# Patient Record
Sex: Male | Born: 1965 | Race: White | Hispanic: No | Marital: Single | Smoking: Former smoker
Health system: Southern US, Community
[De-identification: ages and names within clinical notes are randomized; demographics above are authoritative.]

## PROBLEM LIST (undated history)

## (undated) DIAGNOSIS — I10 Essential (primary) hypertension: Secondary | ICD-10-CM

## (undated) DIAGNOSIS — I2510 Atherosclerotic heart disease of native coronary artery without angina pectoris: Secondary | ICD-10-CM

## (undated) DIAGNOSIS — E785 Hyperlipidemia, unspecified: Secondary | ICD-10-CM

## (undated) DIAGNOSIS — I214 Non-ST elevation (NSTEMI) myocardial infarction: Secondary | ICD-10-CM

## (undated) DIAGNOSIS — K5792 Diverticulitis of intestine, part unspecified, without perforation or abscess without bleeding: Secondary | ICD-10-CM

## (undated) HISTORY — DX: Hyperlipidemia, unspecified: E78.5

## (undated) HISTORY — DX: Essential (primary) hypertension: I10

## (undated) HISTORY — DX: Atherosclerotic heart disease of native coronary artery without angina pectoris: I25.10

## (undated) HISTORY — DX: Non-ST elevation (NSTEMI) myocardial infarction: I21.4

---
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
## 2012-08-16 ENCOUNTER — Emergency Department (HOSPITAL_COMMUNITY): Payer: Self-pay

## 2012-08-16 ENCOUNTER — Inpatient Hospital Stay (HOSPITAL_COMMUNITY)
Admission: EM | Admit: 2012-08-16 | Discharge: 2012-08-20 | DRG: 184 | Disposition: A | Discharge status: Home / Self Care | Payer: MEDICAID | Attending: General Surgery | Admitting: General Surgery

## 2012-08-16 ENCOUNTER — Encounter (HOSPITAL_COMMUNITY): Payer: Self-pay | Admitting: *Deleted

## 2012-08-16 DIAGNOSIS — F10229 Alcohol dependence with intoxication, unspecified: Secondary | ICD-10-CM | POA: Diagnosis present

## 2012-08-16 DIAGNOSIS — S81812A Laceration without foreign body, left lower leg, initial encounter: Secondary | ICD-10-CM

## 2012-08-16 DIAGNOSIS — IMO0002 Reserved for concepts with insufficient information to code with codable children: Secondary | ICD-10-CM | POA: Diagnosis not present

## 2012-08-16 DIAGNOSIS — S32009A Unspecified fracture of unspecified lumbar vertebra, initial encounter for closed fracture: Secondary | ICD-10-CM

## 2012-08-16 DIAGNOSIS — S2242XA Multiple fractures of ribs, left side, initial encounter for closed fracture: Secondary | ICD-10-CM

## 2012-08-16 DIAGNOSIS — J90 Pleural effusion, not elsewhere classified: Secondary | ICD-10-CM | POA: Diagnosis not present

## 2012-08-16 DIAGNOSIS — S2249XA Multiple fractures of ribs, unspecified side, initial encounter for closed fracture: Principal | ICD-10-CM | POA: Diagnosis present

## 2012-08-16 DIAGNOSIS — S2243XA Multiple fractures of ribs, bilateral, initial encounter for closed fracture: Secondary | ICD-10-CM

## 2012-08-16 DIAGNOSIS — F10129 Alcohol abuse with intoxication, unspecified: Secondary | ICD-10-CM | POA: Diagnosis present

## 2012-08-16 DIAGNOSIS — F1010 Alcohol abuse, uncomplicated: Secondary | ICD-10-CM

## 2012-08-16 DIAGNOSIS — S2231XA Fracture of one rib, right side, initial encounter for closed fracture: Secondary | ICD-10-CM

## 2012-08-16 DIAGNOSIS — E876 Hypokalemia: Secondary | ICD-10-CM | POA: Diagnosis present

## 2012-08-16 DIAGNOSIS — D72829 Elevated white blood cell count, unspecified: Secondary | ICD-10-CM | POA: Diagnosis present

## 2012-08-16 DIAGNOSIS — F17200 Nicotine dependence, unspecified, uncomplicated: Secondary | ICD-10-CM | POA: Diagnosis present

## 2012-08-16 LAB — MAGNESIUM: Magnesium: 1.8 mg/dL (ref 1.5–2.5)

## 2012-08-16 LAB — POCT I-STAT, CHEM 8
BUN: 5 mg/dL — ABNORMAL LOW (ref 6–23)
Calcium, Ion: 0.97 mmol/L — ABNORMAL LOW (ref 1.12–1.23)
Chloride: 113 mEq/L — ABNORMAL HIGH (ref 96–112)
Potassium: 2.5 mEq/L — CL (ref 3.5–5.1)

## 2012-08-16 LAB — COMPREHENSIVE METABOLIC PANEL
AST: 84 U/L — ABNORMAL HIGH (ref 0–37)
Albumin: 3.2 g/dL — ABNORMAL LOW (ref 3.5–5.2)
Alkaline Phosphatase: 59 U/L (ref 39–117)
Chloride: 112 mEq/L (ref 96–112)
Potassium: 2.4 mEq/L — CL (ref 3.5–5.1)
Sodium: 140 mEq/L (ref 135–145)
Total Bilirubin: 0.1 mg/dL — ABNORMAL LOW (ref 0.3–1.2)

## 2012-08-16 LAB — CBC
HCT: 34.4 % — ABNORMAL LOW (ref 39.0–52.0)
MCH: 31.1 pg (ref 26.0–34.0)
MCV: 88.4 fL (ref 78.0–100.0)
Platelets: 225 10*3/uL (ref 150–400)
RDW: 13.2 % (ref 11.5–15.5)

## 2012-08-16 LAB — CDS SEROLOGY

## 2012-08-16 LAB — SAMPLE TO BLOOD BANK

## 2012-08-16 MED ORDER — IOHEXOL 300 MG/ML  SOLN: 100.0000 mL | Freq: Once | INTRAMUSCULAR | Status: AC | PRN

## 2012-08-16 MED ORDER — SODIUM CHLORIDE 0.9 % IV BOLUS (SEPSIS)
500.0000 mL | Freq: Once | INTRAVENOUS | Status: AC
  Administered 2012-08-16: 500 mL via INTRAVENOUS

## 2012-08-16 MED ORDER — ONDANSETRON HCL 4 MG/2ML IJ SOLN
4.0000 mg | Freq: Once | INTRAMUSCULAR | Status: AC
  Administered 2012-08-16: 4 mg via INTRAVENOUS
  Filled 2012-08-16: qty 2

## 2012-08-16 MED ORDER — HYDROMORPHONE HCL PF 1 MG/ML IJ SOLN
1.0000 mg | Freq: Once | INTRAMUSCULAR | Status: AC
  Administered 2012-08-16: 1 mg via INTRAVENOUS
  Filled 2012-08-16: qty 1

## 2012-08-16 MED ORDER — TETANUS-DIPHTH-ACELL PERTUSSIS 5-2.5-18.5 LF-MCG/0.5 IM SUSP
0.5000 mL | Freq: Once | INTRAMUSCULAR | Status: AC
  Administered 2012-08-16: 0.5 mL via INTRAMUSCULAR
  Filled 2012-08-16: qty 0.5

## 2012-08-16 MED ORDER — POTASSIUM CHLORIDE 10 MEQ/100ML IV SOLN
10.0000 meq | INTRAVENOUS | Status: AC
  Administered 2012-08-17 (×3): 10 meq via INTRAVENOUS
  Filled 2012-08-16 (×2): qty 100

## 2012-08-16 MED ORDER — POTASSIUM CHLORIDE 10 MEQ/100ML IV SOLN
10.0000 meq | INTRAVENOUS | Status: DC
  Filled 2012-08-16: qty 100

## 2012-08-16 NOTE — ED Notes (Signed)
X-ray at bedside

## 2012-08-16 NOTE — ED Notes (Signed)
MD at bedside. 
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
## 2012-08-17 ENCOUNTER — Encounter (HOSPITAL_COMMUNITY): Payer: Self-pay | Admitting: *Deleted

## 2012-08-17 DIAGNOSIS — S32009A Unspecified fracture of unspecified lumbar vertebra, initial encounter for closed fracture: Secondary | ICD-10-CM

## 2012-08-17 DIAGNOSIS — F10129 Alcohol abuse with intoxication, unspecified: Secondary | ICD-10-CM | POA: Diagnosis present

## 2012-08-17 DIAGNOSIS — E876 Hypokalemia: Secondary | ICD-10-CM | POA: Diagnosis present

## 2012-08-17 DIAGNOSIS — S2243XA Multiple fractures of ribs, bilateral, initial encounter for closed fracture: Secondary | ICD-10-CM

## 2012-08-17 DIAGNOSIS — S81812A Laceration without foreign body, left lower leg, initial encounter: Secondary | ICD-10-CM

## 2012-08-17 DIAGNOSIS — S2249XA Multiple fractures of ribs, unspecified side, initial encounter for closed fracture: Secondary | ICD-10-CM

## 2012-08-17 LAB — CBC
HCT: 39 % (ref 39.0–52.0)
Hemoglobin: 13.6 g/dL (ref 13.0–17.0)
MCH: 31.1 pg (ref 26.0–34.0)
MCHC: 34.9 g/dL (ref 30.0–36.0)
MCV: 89 fL (ref 78.0–100.0)
RBC: 4.38 MIL/uL (ref 4.22–5.81)

## 2012-08-17 LAB — BASIC METABOLIC PANEL
BUN: 8 mg/dL (ref 6–23)
CO2: 20 mEq/L (ref 19–32)
Calcium: 8.3 mg/dL — ABNORMAL LOW (ref 8.4–10.5)
GFR calc non Af Amer: 85 mL/min — ABNORMAL LOW (ref >90)
Glucose, Bld: 111 mg/dL — ABNORMAL HIGH (ref 70–99)
Sodium: 137 mEq/L (ref 135–145)

## 2012-08-17 LAB — ETHANOL: Alcohol, Ethyl (B): 202 mg/dL — ABNORMAL HIGH (ref 0–11)

## 2012-08-17 MED ORDER — LORAZEPAM 2 MG/ML IJ SOLN
0.0000 mg | Freq: Four times a day (QID) | INTRAMUSCULAR | Status: AC
  Administered 2012-08-17 (×2): 1 mg via INTRAVENOUS
  Administered 2012-08-18: 2 mg via INTRAVENOUS
  Filled 2012-08-17 (×4): qty 1

## 2012-08-17 MED ORDER — WHITE PETROLATUM GEL
Status: AC
  Administered 2012-08-17: 06:00:00
  Filled 2012-08-17: qty 5

## 2012-08-17 MED ORDER — HYDROMORPHONE 0.3 MG/ML IV SOLN
INTRAVENOUS | Status: DC
  Administered 2012-08-17: 3.6 mg via INTRAVENOUS
  Administered 2012-08-17: 02:00:00 via INTRAVENOUS
  Filled 2012-08-17: qty 25

## 2012-08-17 MED ORDER — PANTOPRAZOLE SODIUM 40 MG IV SOLR
40.0000 mg | Freq: Every day | INTRAVENOUS | Status: DC
  Administered 2012-08-17: 40 mg via INTRAVENOUS
  Filled 2012-08-17 (×2): qty 40

## 2012-08-17 MED ORDER — NALOXONE HCL 0.4 MG/ML IJ SOLN: 0.4000 mg | INTRAMUSCULAR | Status: DC | PRN

## 2012-08-17 MED ORDER — OXYCODONE HCL 5 MG PO TABS
5.0000 mg | ORAL_TABLET | ORAL | Status: DC | PRN
  Administered 2012-08-17: 15 mg via ORAL
  Administered 2012-08-17: 10 mg via ORAL
  Administered 2012-08-18 – 2012-08-19 (×4): 15 mg via ORAL
  Administered 2012-08-19: 10 mg via ORAL
  Administered 2012-08-19: 15 mg via ORAL
  Administered 2012-08-20: 10 mg via ORAL
  Filled 2012-08-17: qty 2
  Filled 2012-08-17: qty 3
  Filled 2012-08-17: qty 2
  Filled 2012-08-17 (×6): qty 3

## 2012-08-17 MED ORDER — METHOCARBAMOL 500 MG PO TABS
500.0000 mg | ORAL_TABLET | Freq: Four times a day (QID) | ORAL | Status: DC | PRN
  Administered 2012-08-18 – 2012-08-20 (×6): 500 mg via ORAL
  Filled 2012-08-17 (×8): qty 1

## 2012-08-17 MED ORDER — THIAMINE HCL 100 MG/ML IJ SOLN
Freq: Once | INTRAVENOUS | Status: AC
  Administered 2012-08-17: 04:00:00 via INTRAVENOUS
  Filled 2012-08-17: qty 1000

## 2012-08-17 MED ORDER — ONDANSETRON HCL 4 MG/2ML IJ SOLN: 4.0000 mg | Freq: Four times a day (QID) | INTRAMUSCULAR | Status: DC | PRN

## 2012-08-17 MED ORDER — SODIUM CHLORIDE 0.9 % IV BOLUS (SEPSIS)
1000.0000 mL | Freq: Once | INTRAVENOUS | Status: AC
  Administered 2012-08-17: 1000 mL via INTRAVENOUS

## 2012-08-17 MED ORDER — NAPROXEN 500 MG PO TABS
500.0000 mg | ORAL_TABLET | Freq: Two times a day (BID) | ORAL | Status: DC
  Administered 2012-08-17 – 2012-08-20 (×5): 500 mg via ORAL
  Filled 2012-08-17 (×8): qty 1

## 2012-08-17 MED ORDER — DIPHENHYDRAMINE HCL 50 MG/ML IJ SOLN: 12.5000 mg | Freq: Four times a day (QID) | INTRAMUSCULAR | Status: DC | PRN

## 2012-08-17 MED ORDER — LORAZEPAM 2 MG/ML IJ SOLN
1.0000 mg | Freq: Once | INTRAMUSCULAR | Status: AC
  Administered 2012-08-17: 1 mg via INTRAVENOUS
  Filled 2012-08-17: qty 1

## 2012-08-17 MED ORDER — DEXTROSE IN LACTATED RINGERS 5 % IV SOLN
INTRAVENOUS | Status: DC
  Administered 2012-08-17: 02:00:00 via INTRAVENOUS

## 2012-08-17 MED ORDER — HYDROMORPHONE HCL PF 1 MG/ML IJ SOLN: 0.5000 mg | INTRAMUSCULAR | Status: DC | PRN

## 2012-08-17 MED ORDER — HYDROMORPHONE HCL PF 1 MG/ML IJ SOLN
0.5000 mg | INTRAMUSCULAR | Status: DC | PRN
  Administered 2012-08-17 – 2012-08-19 (×5): 0.5 mg via INTRAVENOUS
  Filled 2012-08-17 (×5): qty 1

## 2012-08-17 MED ORDER — LORAZEPAM 2 MG/ML IJ SOLN
0.0000 mg | Freq: Two times a day (BID) | INTRAMUSCULAR | Status: DC
  Administered 2012-08-19 (×2): 2 mg via INTRAVENOUS
  Filled 2012-08-17 (×2): qty 1

## 2012-08-17 MED ORDER — SODIUM CHLORIDE 0.9 % IJ SOLN: 9.0000 mL | INTRAMUSCULAR | Status: DC | PRN

## 2012-08-17 MED ORDER — ONDANSETRON HCL 4 MG PO TABS: 4.0000 mg | ORAL_TABLET | Freq: Four times a day (QID) | ORAL | Status: DC | PRN

## 2012-08-17 MED ORDER — DIPHENHYDRAMINE HCL 12.5 MG/5ML PO ELIX
12.5000 mg | ORAL_SOLUTION | Freq: Four times a day (QID) | ORAL | Status: DC | PRN
  Filled 2012-08-17: qty 5

## 2012-08-17 MED ORDER — ENOXAPARIN SODIUM 30 MG/0.3ML ~~LOC~~ SOLN
30.0000 mg | Freq: Two times a day (BID) | SUBCUTANEOUS | Status: DC
  Administered 2012-08-17 – 2012-08-20 (×5): 30 mg via SUBCUTANEOUS
  Filled 2012-08-17 (×11): qty 0.3

## 2012-08-17 MED ORDER — ENOXAPARIN SODIUM 40 MG/0.4ML ~~LOC~~ SOLN
40.0000 mg | SUBCUTANEOUS | Status: DC
  Administered 2012-08-17: 40 mg via SUBCUTANEOUS
  Filled 2012-08-17 (×2): qty 0.4

## 2012-08-17 MED ORDER — IOHEXOL 300 MG/ML  SOLN
100.0000 mL | Freq: Once | INTRAMUSCULAR | Status: AC | PRN
  Administered 2012-08-17: 100 mL via INTRAVENOUS

## 2012-08-17 MED ORDER — BACITRACIN ZINC 500 UNIT/GM EX OINT
TOPICAL_OINTMENT | Freq: Two times a day (BID) | CUTANEOUS | Status: DC
  Administered 2012-08-17 – 2012-08-18 (×3): via TOPICAL
  Administered 2012-08-19: 1 via TOPICAL
  Administered 2012-08-19: 13:00:00 via TOPICAL
  Filled 2012-08-17: qty 28.35

## 2012-08-17 MED ORDER — PANTOPRAZOLE SODIUM 40 MG PO TBEC: 40.0000 mg | DELAYED_RELEASE_TABLET | Freq: Every day | ORAL | Status: DC
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
## 2012-08-17 NOTE — Progress Notes (Signed)
UR complete 

## 2012-08-17 NOTE — Progress Notes (Signed)
Good pain control now.  D/Cing PCA.  Will have PT/OT eval. To floor. Patient examined and I agree with the assessment and plan  Violeta Gelinas, MD, MPH, FACS Pager: [phone]  08/17/2012 12:27 PM

## 2012-08-18 ENCOUNTER — Inpatient Hospital Stay (HOSPITAL_COMMUNITY): Payer: MEDICAID

## 2012-08-18 ENCOUNTER — Inpatient Hospital Stay (HOSPITAL_COMMUNITY): Payer: Self-pay

## 2012-08-18 DIAGNOSIS — S91009A Unspecified open wound, unspecified ankle, initial encounter: Secondary | ICD-10-CM

## 2012-08-18 DIAGNOSIS — J90 Pleural effusion, not elsewhere classified: Secondary | ICD-10-CM

## 2012-08-18 DIAGNOSIS — S81809A Unspecified open wound, unspecified lower leg, initial encounter: Secondary | ICD-10-CM

## 2012-08-18 DIAGNOSIS — S81009A Unspecified open wound, unspecified knee, initial encounter: Secondary | ICD-10-CM

## 2012-08-18 LAB — BASIC METABOLIC PANEL
BUN: 10 mg/dL (ref 6–23)
CO2: 24 mEq/L (ref 19–32)
Calcium: 9.3 mg/dL (ref 8.4–10.5)
Chloride: 100 mEq/L (ref 96–112)
Creatinine, Ser: 0.8 mg/dL (ref 0.50–1.35)

## 2012-08-18 LAB — CBC
HCT: 38.3 % — ABNORMAL LOW (ref 39.0–52.0)
MCH: 30.4 pg (ref 26.0–34.0)
MCV: 88.9 fL (ref 78.0–100.0)
Platelets: 212 10*3/uL (ref 150–400)
RDW: 13.3 % (ref 11.5–15.5)
WBC: 10.9 10*3/uL — ABNORMAL HIGH (ref 4.0–10.5)

## 2012-08-18 MED ORDER — TRAMADOL HCL 50 MG PO TABS
50.0000 mg | ORAL_TABLET | Freq: Four times a day (QID) | ORAL | Status: DC
  Administered 2012-08-18: 50 mg via ORAL
  Administered 2012-08-18 – 2012-08-19 (×4): 100 mg via ORAL
  Administered 2012-08-20: 50 mg via ORAL
  Administered 2012-08-20: 100 mg via ORAL
  Filled 2012-08-18 (×8): qty 2

## 2012-08-18 MED ORDER — IPRATROPIUM-ALBUTEROL 20-100 MCG/ACT IN AERS
1.0000 | INHALATION_SPRAY | Freq: Four times a day (QID) | RESPIRATORY_TRACT | Status: DC
  Administered 2012-08-18: 1 via RESPIRATORY_TRACT
  Filled 2012-08-18: qty 4

## 2012-08-18 MED ORDER — IPRATROPIUM-ALBUTEROL 20-100 MCG/ACT IN AERS: 2.0000 | INHALATION_SPRAY | Freq: Four times a day (QID) | RESPIRATORY_TRACT | Status: DC | PRN
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
## 2012-08-18 NOTE — ED Provider Notes (Signed)
I saw and evaluated the patient, reviewed the resident's note and I agree with the findings and plan. See H and P by me.  Small lac to left ant lower leg. Sutured by resident - procedure supervised by me.   Suzi Roots, MD 08/18/12 [phone]
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
## 2012-08-19 DIAGNOSIS — D72829 Elevated white blood cell count, unspecified: Secondary | ICD-10-CM

## 2012-08-19 LAB — CBC
HCT: 38.2 % — ABNORMAL LOW (ref 39.0–52.0)
Hemoglobin: 13.3 g/dL (ref 13.0–17.0)
MCHC: 34.8 g/dL (ref 30.0–36.0)
RDW: 13.1 % (ref 11.5–15.5)
WBC: 9 10*3/uL (ref 4.0–10.5)

## 2012-08-19 MED ORDER — NAPROXEN 500 MG PO TABS: 500.0000 mg | ORAL_TABLET | Freq: Two times a day (BID) | ORAL | Status: DC

## 2012-08-19 MED ORDER — METHOCARBAMOL 500 MG PO TABS: 750.0000 mg | ORAL_TABLET | Freq: Four times a day (QID) | ORAL | Status: DC | PRN

## 2012-08-19 MED ORDER — IPRATROPIUM-ALBUTEROL 20-100 MCG/ACT IN AERS: 2.0000 | INHALATION_SPRAY | Freq: Four times a day (QID) | RESPIRATORY_TRACT | Status: DC | PRN

## 2012-08-19 MED ORDER — OXYCODONE HCL 5 MG PO TABS: 5.0000 mg | ORAL_TABLET | ORAL | Status: DC | PRN

## 2012-08-19 MED ORDER — IBUPROFEN 200 MG PO TABS: 600.0000 mg | ORAL_TABLET | Freq: Four times a day (QID) | ORAL | Status: DC | PRN

## 2012-08-19 NOTE — Progress Notes (Signed)
Patient is being discharged from PT services secondary to:  Patient refused PT twice this a.m. (after pre-medication for pain). States he doesn't want another bill and he can get up on his own. Lengthy discussion re: probable difficulties at home (bed mobility, steps) and he dismissed these concerns stating he'll be OK. He again stated he does not have anyone lined up to stay with him. "I'm an asshole and no one is going to want to put up with me." Reported his father might be an option and will likely be the person to drive him home from the hospital.   Please see latest Therapy Progress Note for current level of functioning and progress toward goals.  Progress and discharge plan and discussed with patient and they  Agree (pt insisted on discharge from PT).   08/19/2012 Veda Canning, PT Pager: [phone]
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
## 2012-08-19 NOTE — Clinical Documentation Improvement (Signed)
THIS DOCUMENT IS NOT A PERMANENT PART OF THE MEDICAL RECORD  Please update your documentation with the medical record to reflect your response to this query. If you need help knowing how to do this please call [phone].  08/19/12   Dr. Janee Morn,  In a better effort to capture your patient's severity of illness, reflect appropriate length of stay and utilization of resources, a review of the patient medical record has revealed the following information:  Left pleural effusion/consolidation - consider starting Levaquin for pneumonia vs chest tube or both, start combivent, pulm toilet Progress Note 08/18/12  Left pleural effusion/consolidation - Pt's symptoms are no worse, but no better because he refuses to use the IS, start combivent, pulm toilet Progress Note 08/19/12   Based on your clinical judgment, please document in the progress notes and discharge summary if the diagnosis of "Pneumonia" is:   - ruled out  If the diagnosis of Pneumonia is Suspected, Probable or Likely it must be documented as such in the Discharge Summary unless otherwise confirmed.   In responding to this query please exercise your independent judgment.    The fact that a query is asked, does not imply that any particular answer is desired or expected.    Reviewed:  no additional documentation provided  Thank You,  Jerral Ralph  RN BSN CCDS Certified Clinical Documentation Specialist: [phone] Health Information Management [place]
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
## 2012-08-19 NOTE — Progress Notes (Signed)
Apologized for being difficult.  A lot of pain after mobilizing. Worried about getting PNA. Did 1000 on IS for me. He has someone he can go with tomorrow so we will keep until then and work on pulmonary toilet and pain control.  Lateral CXR yesterday showed L effusion to be small.
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
## 2012-08-20 NOTE — Progress Notes (Signed)
Discharge home. Home discharge instruction given, verbalizes understanding. Case management helped patient with meds.
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
## 2012-08-23 ENCOUNTER — Telehealth (INDEPENDENT_AMBULATORY_CARE_PROVIDER_SITE_OTHER): Payer: Self-pay | Admitting: Orthopedic Surgery

## 2012-08-23 ENCOUNTER — Telehealth (HOSPITAL_COMMUNITY): Payer: Self-pay | Admitting: Emergency Medicine

## 2012-08-23 NOTE — Telephone Encounter (Signed)
Confirmed appt for this Friday 

## 2012-08-23 NOTE — Telephone Encounter (Signed)
Confirmed appt for this Friday

## 2012-08-25 ENCOUNTER — Telehealth (HOSPITAL_COMMUNITY): Payer: Self-pay | Admitting: Emergency Medicine

## 2012-08-25 NOTE — Telephone Encounter (Signed)
Confirmed appt at 10.

## 2012-08-26 ENCOUNTER — Ambulatory Visit (INDEPENDENT_AMBULATORY_CARE_PROVIDER_SITE_OTHER): Payer: Self-pay | Admitting: Internal Medicine

## 2012-08-26 ENCOUNTER — Encounter (INDEPENDENT_AMBULATORY_CARE_PROVIDER_SITE_OTHER): Payer: Self-pay

## 2012-08-26 DIAGNOSIS — S32008D Other fracture of unspecified lumbar vertebra, subsequent encounter for fracture with routine healing: Secondary | ICD-10-CM

## 2012-08-26 DIAGNOSIS — IMO0001 Reserved for inherently not codable concepts without codable children: Secondary | ICD-10-CM

## 2012-08-26 DIAGNOSIS — S81812D Laceration without foreign body, left lower leg, subsequent encounter: Secondary | ICD-10-CM

## 2012-08-26 DIAGNOSIS — S2249XD Multiple fractures of ribs, unspecified side, subsequent encounter for fracture with routine healing: Secondary | ICD-10-CM

## 2012-08-26 DIAGNOSIS — Z5189 Encounter for other specified aftercare: Secondary | ICD-10-CM

## 2012-08-26 DIAGNOSIS — IMO0002 Reserved for concepts with insufficient information to code with codable children: Secondary | ICD-10-CM
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
## 2012-09-02 ENCOUNTER — Encounter (INDEPENDENT_AMBULATORY_CARE_PROVIDER_SITE_OTHER): Payer: Self-pay

## 2015-05-29 ENCOUNTER — Emergency Department (HOSPITAL_COMMUNITY)
Admission: EM | Admit: 2015-05-29 | Discharge: 2015-05-29 | Disposition: A | Discharge status: Home / Self Care | Payer: No Typology Code available for payment source | Attending: Emergency Medicine | Admitting: Emergency Medicine

## 2015-05-29 ENCOUNTER — Encounter (HOSPITAL_COMMUNITY): Payer: Self-pay | Admitting: Emergency Medicine

## 2015-05-29 DIAGNOSIS — L02213 Cutaneous abscess of chest wall: Secondary | ICD-10-CM | POA: Insufficient documentation

## 2015-05-29 DIAGNOSIS — F17200 Nicotine dependence, unspecified, uncomplicated: Secondary | ICD-10-CM | POA: Insufficient documentation

## 2015-05-29 MED ORDER — DOXYCYCLINE HYCLATE 100 MG PO CAPS: 100.0000 mg | ORAL_CAPSULE | Freq: Two times a day (BID) | ORAL | Status: DC

## 2015-05-29 MED ORDER — OXYCODONE-ACETAMINOPHEN 5-325 MG PO TABS: 1.0000 | ORAL_TABLET | Freq: Four times a day (QID) | ORAL | Status: DC | PRN

## 2015-05-29 MED ORDER — SODIUM BICARBONATE 4 % IV SOLN
5.0000 mL | Freq: Once | INTRAVENOUS | Status: AC
  Administered 2015-05-29: 5 mL via SUBCUTANEOUS
  Filled 2015-05-29: qty 5

## 2015-05-29 MED ORDER — LIDOCAINE HCL (PF) 1 % IJ SOLN
5.0000 mL | Freq: Once | INTRAMUSCULAR | Status: AC
  Administered 2015-05-29: 5 mL
  Filled 2015-05-29: qty 30
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
## 2015-05-29 NOTE — Discharge Instructions (Signed)
Since there was a mass in the breast prior to the likely abscess follow-up with general surgery.  Abscess An abscess is an infected area that contains a collection of pus and debris.It can occur in almost any part of the body. An abscess is also known as a furuncle or boil. CAUSES  An abscess occurs when tissue gets infected. This can occur from blockage of oil or sweat glands, infection of hair follicles, or a minor injury to the skin. As the body tries to fight the infection, pus collects in the area and creates pressure under the skin. This pressure causes pain. People with weakened immune systems have difficulty fighting infections and get certain abscesses more often.  SYMPTOMS Usually an abscess develops on the skin and becomes a painful mass that is red, warm, and tender. If the abscess forms under the skin, you may feel a moveable soft area under the skin. Some abscesses break open (rupture) on their own, but most will continue to get worse without care. The infection can spread deeper into the body and eventually into the bloodstream, causing you to feel ill.  DIAGNOSIS  Your caregiver will take your medical history and perform a physical exam. A sample of fluid may also be taken from the abscess to determine what is causing your infection. TREATMENT  Your caregiver may prescribe antibiotic medicines to fight the infection. However, taking antibiotics alone usually does not cure an abscess. Your caregiver may need to make a small cut (incision) in the abscess to drain the pus. In some cases, gauze is packed into the abscess to reduce pain and to continue draining the area. HOME CARE INSTRUCTIONS   Only take over-the-counter or prescription medicines for pain, discomfort, or fever as directed by your caregiver.  If you were prescribed antibiotics, take them as directed. Finish them even if you start to feel better.  If gauze is used, follow your caregiver's directions for changing the  gauze.  To avoid spreading the infection:  Keep your draining abscess covered with a bandage.  Wash your hands well.  Do not share personal care items, towels, or whirlpools with others.  Avoid skin contact with others.  Keep your skin and clothes clean around the abscess.  Keep all follow-up appointments as directed by your caregiver. SEEK MEDICAL CARE IF:   You have increased pain, swelling, redness, fluid drainage, or bleeding.  You have muscle aches, chills, or a general ill feeling.  You have a fever. MAKE SURE YOU:   Understand these instructions.  Will watch your condition.  Will get help right away if you are not doing well or get worse.   This information is not intended to replace advice given to you by your health care provider. Make sure you discuss any questions you have with your health care provider.   Document Released: 10/08/2004 Document Revised: 06/30/2011 Document Reviewed: 03/13/2011 Elsevier Interactive Patient Education 2016 [employer]  Incision and Drainage Incision and drainage is a procedure in which a sac-like structure (cystic structure) is opened and drained. The area to be drained usually contains material such as pus, fluid, or blood.  LET YOUR CAREGIVER KNOW ABOUT:   Allergies to medicine.  Medicines taken, including vitamins, herbs, eyedrops, over-the-counter medicines, and creams.  Use of steroids (by mouth or creams).  Previous problems with anesthetics or numbing medicines.  History of bleeding problems or blood clots.  Previous surgery.  Other health problems, including diabetes and kidney problems.  Possibility of pregnancy,  if this applies. RISKS AND COMPLICATIONS  Pain.  Bleeding.  Scarring.  Infection. BEFORE THE PROCEDURE  You may need to have an ultrasound or other imaging tests to see how large or deep your cystic structure is. Blood tests may also be used to determine if you have an infection or how  severe the infection is. You may need to have a tetanus shot. PROCEDURE  The affected area is cleaned with a cleaning fluid. The cyst area will then be numbed with a medicine (local anesthetic). A small incision will be made in the cystic structure. A syringe or catheter may be used to drain the contents of the cystic structure, or the contents may be squeezed out. The area will then be flushed with a cleansing solution. After cleansing the area, it is often gently packed with a gauze or another wound dressing. Once it is packed, it will be covered with gauze and tape or some other type of wound dressing. AFTER THE PROCEDURE   Often, you will be allowed to go home right after the procedure.  You may be given antibiotic medicine to prevent or heal an infection.  If the area was packed with gauze or some other wound dressing, you will likely need to come back in 1 to 2 days to get it removed.  The area should heal in about 14 days.   This information is not intended to replace advice given to you by your health care provider. Make sure you discuss any questions you have with your health care provider.   Document Released: 06/24/2000 Document Revised: 06/30/2011 Document Reviewed: 02/23/2011 Elsevier Interactive Patient Education [employer]

## 2015-05-29 NOTE — ED Notes (Signed)
Pt reports right breast boil, sts he "poppud it " , obvious redness around right breast. denies fever. Alert and oriented x 4.

## 2016-09-12 DIAGNOSIS — K5792 Diverticulitis of intestine, part unspecified, without perforation or abscess without bleeding: Secondary | ICD-10-CM

## 2016-09-12 HISTORY — DX: Diverticulitis of intestine, part unspecified, without perforation or abscess without bleeding: K57.92

## 2016-10-01 DIAGNOSIS — E876 Hypokalemia: Secondary | ICD-10-CM | POA: Diagnosis not present

## 2016-10-01 DIAGNOSIS — K5900 Constipation, unspecified: Secondary | ICD-10-CM | POA: Diagnosis present

## 2016-10-01 DIAGNOSIS — F17210 Nicotine dependence, cigarettes, uncomplicated: Secondary | ICD-10-CM | POA: Diagnosis present

## 2016-10-01 DIAGNOSIS — B9620 Unspecified Escherichia coli [E. coli] as the cause of diseases classified elsewhere: Secondary | ICD-10-CM | POA: Diagnosis present

## 2016-10-01 DIAGNOSIS — N390 Urinary tract infection, site not specified: Secondary | ICD-10-CM | POA: Diagnosis present

## 2016-10-01 DIAGNOSIS — K5720 Diverticulitis of large intestine with perforation and abscess without bleeding: Secondary | ICD-10-CM | POA: Diagnosis present

## 2016-10-01 DIAGNOSIS — A419 Sepsis, unspecified organism: Principal | ICD-10-CM | POA: Diagnosis present

## 2016-10-01 NOTE — ED Triage Notes (Signed)
Patient arrives by EMS with complaints of abdominal pain-sharp rectal pain and constipation since Saturday.Using laxatives with no relief. BP 136/100 HR 114 abd pain 6/10 with intermittent 10/10 cramping pain. O2 sat 96% RA.

## 2016-10-02 ENCOUNTER — Inpatient Hospital Stay (HOSPITAL_COMMUNITY)
Admission: EM | Admit: 2016-10-02 | Discharge: 2016-10-09 | DRG: 872 | Disposition: A | Discharge status: Home / Self Care | Payer: Self-pay | Attending: Internal Medicine | Admitting: Internal Medicine

## 2016-10-02 ENCOUNTER — Encounter (HOSPITAL_COMMUNITY): Payer: Self-pay

## 2016-10-02 ENCOUNTER — Emergency Department (HOSPITAL_COMMUNITY): Payer: Self-pay

## 2016-10-02 DIAGNOSIS — R369 Urethral discharge, unspecified: Secondary | ICD-10-CM

## 2016-10-02 DIAGNOSIS — N390 Urinary tract infection, site not specified: Secondary | ICD-10-CM | POA: Diagnosis present

## 2016-10-02 DIAGNOSIS — K5740 Diverticulitis of both small and large intestine with perforation and abscess without bleeding: Secondary | ICD-10-CM

## 2016-10-02 DIAGNOSIS — N342 Other urethritis: Secondary | ICD-10-CM

## 2016-10-02 DIAGNOSIS — K5792 Diverticulitis of intestine, part unspecified, without perforation or abscess without bleeding: Secondary | ICD-10-CM | POA: Diagnosis present

## 2016-10-02 DIAGNOSIS — K651 Peritoneal abscess: Secondary | ICD-10-CM

## 2016-10-02 DIAGNOSIS — K5720 Diverticulitis of large intestine with perforation and abscess without bleeding: Secondary | ICD-10-CM

## 2016-10-02 LAB — COMPREHENSIVE METABOLIC PANEL
ALT: 32 U/L (ref 17–63)
ANION GAP: 12 (ref 5–15)
AST: 26 U/L (ref 15–41)
Albumin: 3.9 g/dL (ref 3.5–5.0)
Alkaline Phosphatase: 87 U/L (ref 38–126)
BILIRUBIN TOTAL: 0.8 mg/dL (ref 0.3–1.2)
BUN: 16 mg/dL (ref 6–20)
CO2: 23 mmol/L (ref 22–32)
Calcium: 9.5 mg/dL (ref 8.9–10.3)
Chloride: 99 mmol/L — ABNORMAL LOW (ref 101–111)
Creatinine, Ser: 1.02 mg/dL (ref 0.61–1.24)
GFR calc Af Amer: >60 mL/min (ref >60)
Glucose, Bld: 130 mg/dL — ABNORMAL HIGH (ref 65–99)
Potassium: 3.6 mmol/L (ref 3.5–5.1)
Sodium: 134 mmol/L — ABNORMAL LOW (ref 135–145)
TOTAL PROTEIN: 8.8 g/dL — AB (ref 6.5–8.1)

## 2016-10-02 LAB — URINALYSIS, ROUTINE W REFLEX MICROSCOPIC
BILIRUBIN URINE: NEGATIVE
GLUCOSE, UA: NEGATIVE mg/dL
Ketones, ur: 5 mg/dL — AB
NITRITE: POSITIVE — AB
PH: 5 (ref 5.0–8.0)
Protein, ur: >=300 mg/dL — AB
SPECIFIC GRAVITY, URINE: 1.022 (ref 1.005–1.030)
Squamous Epithelial / LPF: NONE SEEN

## 2016-10-02 LAB — GLUCOSE, CAPILLARY: GLUCOSE-CAPILLARY: 141 mg/dL — AB (ref 65–99)

## 2016-10-02 LAB — PROTIME-INR
INR: 1.07
PROTHROMBIN TIME: 13.8 s (ref 11.4–15.2)

## 2016-10-02 LAB — CBC
HCT: 41.3 % (ref 39.0–52.0)
Hemoglobin: 14.7 g/dL (ref 13.0–17.0)
MCH: 31 pg (ref 26.0–34.0)
MCHC: 35.6 g/dL (ref 30.0–36.0)
MCV: 87.1 fL (ref 78.0–100.0)
Platelets: 300 10*3/uL (ref 150–400)
RBC: 4.74 MIL/uL (ref 4.22–5.81)
RDW: 12.4 % (ref 11.5–15.5)
WBC: 21.6 10*3/uL — AB (ref 4.0–10.5)

## 2016-10-02 LAB — LIPASE, BLOOD: Lipase: 22 U/L (ref 11–51)

## 2016-10-02 LAB — OCCULT BLOOD, POC DEVICE: FECAL OCCULT BLD: NEGATIVE

## 2016-10-02 MED ORDER — ACETAMINOPHEN 325 MG PO TABS: 650.0000 mg | ORAL_TABLET | Freq: Four times a day (QID) | ORAL | Status: DC | PRN

## 2016-10-02 MED ORDER — ACETAMINOPHEN 650 MG RE SUPP: 650.0000 mg | Freq: Four times a day (QID) | RECTAL | Status: DC | PRN

## 2016-10-02 MED ORDER — SODIUM CHLORIDE 0.9 % IV BOLUS (SEPSIS)
1000.0000 mL | Freq: Once | INTRAVENOUS | Status: AC
  Administered 2016-10-02: 1000 mL via INTRAVENOUS

## 2016-10-02 MED ORDER — PIPERACILLIN-TAZOBACTAM 3.375 G IVPB 30 MIN
3.3750 g | Freq: Once | INTRAVENOUS | Status: AC
Start: 2016-10-02 — End: 2016-10-02
  Administered 2016-10-02: 3.375 g via INTRAVENOUS
  Filled 2016-10-02: qty 50

## 2016-10-02 MED ORDER — IOPAMIDOL (ISOVUE-300) INJECTION 61%
INTRAVENOUS | Status: AC
Start: 2016-10-02 — End: 2016-10-02
  Administered 2016-10-02: 100 mL via INTRAVENOUS
  Filled 2016-10-02: qty 100

## 2016-10-02 MED ORDER — ONDANSETRON HCL 4 MG/2ML IJ SOLN
4.0000 mg | Freq: Four times a day (QID) | INTRAMUSCULAR | Status: DC | PRN
Start: 2016-10-02 — End: 2016-10-09

## 2016-10-02 MED ORDER — VANCOMYCIN HCL IN DEXTROSE 1-5 GM/200ML-% IV SOLN
1000.0000 mg | Freq: Once | INTRAVENOUS | Status: AC
  Administered 2016-10-02: 1000 mg via INTRAVENOUS
  Filled 2016-10-02: qty 200

## 2016-10-02 MED ORDER — DEXTROSE-NACL 5-0.9 % IV SOLN
INTRAVENOUS | Status: AC
  Administered 2016-10-02 (×2): via INTRAVENOUS

## 2016-10-02 MED ORDER — STERILE WATER FOR INJECTION IJ SOLN
INTRAMUSCULAR | Status: AC
  Administered 2016-10-02: 03:00:00
  Filled 2016-10-02: qty 10

## 2016-10-02 MED ORDER — MORPHINE SULFATE (PF) 4 MG/ML IV SOLN
2.0000 mg | INTRAVENOUS | Status: DC | PRN
  Administered 2016-10-02: 2 mg via INTRAVENOUS
  Filled 2016-10-02: qty 1

## 2016-10-02 MED ORDER — HYDROMORPHONE HCL-NACL 0.5-0.9 MG/ML-% IV SOSY
1.0000 mg | PREFILLED_SYRINGE | INTRAVENOUS | Status: DC | PRN
  Administered 2016-10-02 – 2016-10-08 (×41): 1 mg via INTRAVENOUS
  Filled 2016-10-02 (×42): qty 2

## 2016-10-02 MED ORDER — FENTANYL CITRATE (PF) 100 MCG/2ML IJ SOLN
100.0000 ug | Freq: Once | INTRAMUSCULAR | Status: AC
  Administered 2016-10-02: 100 ug via INTRAVENOUS
  Filled 2016-10-02: qty 2

## 2016-10-02 MED ORDER — LIP MEDEX EX OINT
TOPICAL_OINTMENT | CUTANEOUS | Status: AC
  Filled 2016-10-02: qty 7

## 2016-10-02 MED ORDER — MORPHINE SULFATE (PF) 4 MG/ML IV SOLN: 2.0000 mg | INTRAVENOUS | Status: DC | PRN

## 2016-10-02 MED ORDER — HYDROMORPHONE HCL 1 MG/ML IJ SOLN
1.0000 mg | Freq: Once | INTRAMUSCULAR | Status: AC
  Administered 2016-10-02: 1 mg via INTRAVENOUS
  Filled 2016-10-02: qty 1

## 2016-10-02 MED ORDER — IOPAMIDOL (ISOVUE-300) INJECTION 61%
100.0000 mL | Freq: Once | INTRAVENOUS | Status: AC | PRN
  Administered 2016-10-02: 100 mL via INTRAVENOUS

## 2016-10-02 MED ORDER — ONDANSETRON HCL 4 MG PO TABS: 4.0000 mg | ORAL_TABLET | Freq: Four times a day (QID) | ORAL | Status: DC | PRN

## 2016-10-02 MED ORDER — SODIUM CHLORIDE 0.9 % IV SOLN
INTRAVENOUS | Status: DC
  Administered 2016-10-02: 05:00:00 via INTRAVENOUS

## 2016-10-02 MED ORDER — CEFTRIAXONE SODIUM 1 G IJ SOLR
1.0000 g | Freq: Once | INTRAMUSCULAR | Status: AC
  Administered 2016-10-02: 1 g via INTRAMUSCULAR
  Filled 2016-10-02: qty 10

## 2016-10-02 MED ORDER — MORPHINE SULFATE (PF) 4 MG/ML IV SOLN
1.0000 mg | INTRAVENOUS | Status: DC | PRN
  Administered 2016-10-02: 1 mg via INTRAVENOUS
  Filled 2016-10-02: qty 1

## 2016-10-02 MED ORDER — PIPERACILLIN-TAZOBACTAM 3.375 G IVPB
3.3750 g | Freq: Three times a day (TID) | INTRAVENOUS | Status: DC
  Administered 2016-10-02 – 2016-10-08 (×18): 3.375 g via INTRAVENOUS
  Filled 2016-10-02 (×19): qty 50

## 2016-10-02 MED ORDER — DOXYCYCLINE HYCLATE 100 MG IV SOLR
100.0000 mg | Freq: Once | INTRAVENOUS | Status: DC
  Filled 2016-10-02: qty 100

## 2016-10-02 MED ORDER — LIP MEDEX EX OINT
TOPICAL_OINTMENT | CUTANEOUS | Status: DC | PRN
  Administered 2016-10-02: 08:00:00 via TOPICAL
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
## 2016-10-02 NOTE — Progress Notes (Signed)
Patient refused 4pm CBG check.
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
## 2016-10-02 NOTE — Progress Notes (Signed)
Pharmacy Antibiotic Note  Derrick Villa is a 51 y.o. male with abdominal pain and constipation admitted on 10/02/2016 with intra-abdominal infection.  Pharmacy has been consulted for zosyn dosing.  Plan: Zosyn 3.375g IV q8h (4 hour infusion).  Height: 6' (182.9 cm) Weight: 220 lb (99.8 kg) IBW/kg (Calculated) : 77.6  Temp (24hrs), Avg:98.6 F (37 C), Min:98.6 F (37 C), Max:98.6 F (37 C)   Recent Labs Lab 10/02/16 0044  WBC 21.6*  CREATININE 1.02    Estimated Creatinine Clearance: 104.8 mL/min (by C-G formula based on SCr of 1.02 mg/dL).    No Known Allergies  Antimicrobials this admission: 9/21 rocephin >> x1 9/21 vancomycin >> x1 9/21 zosyn >>  Dose adjustments this admission:   Microbiology results:  BCx:   UCx:    Sputum:    MRSA PCR:   Thank you for allowing pharmacy to be a part of this patient's care.  Lorenza Evangelist 10/02/2016 6:06 AM

## 2016-10-02 NOTE — ED Notes (Signed)
Bed approved [id number]:[id number]

## 2016-10-02 NOTE — Progress Notes (Signed)
Patient seen and examined this morning, admitted overnight by Dr. Gilford Silvius  In brief, this is a 51 year old male admitted with severe abdominal pain, found to have acute diverticulitis with perforation and associated abscess.  General surgery and interventional radiology were consulted.   Sepsis due to acute perforated diverticulitis -Meets criteria for sepsis with white count, tachycardia and a source -Continue Zosyn, appreciate surgery and IR input   Derrick Siegfried M. Elvera Lennox, MD [facility] [phone]  If 7PM-7AM, please contact night-coverage [email or web address] Password [email or web address]
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
## 2016-10-03 ENCOUNTER — Encounter (HOSPITAL_COMMUNITY): Payer: Self-pay

## 2016-10-03 LAB — BASIC METABOLIC PANEL
ANION GAP: 9 (ref 5–15)
BUN: 11 mg/dL (ref 6–20)
CHLORIDE: 102 mmol/L (ref 101–111)
CO2: 25 mmol/L (ref 22–32)
Calcium: 8.7 mg/dL — ABNORMAL LOW (ref 8.9–10.3)
Creatinine, Ser: 0.87 mg/dL (ref 0.61–1.24)
GFR calc Af Amer: >60 mL/min (ref >60)
Glucose, Bld: 98 mg/dL (ref 65–99)
POTASSIUM: 3.3 mmol/L — AB (ref 3.5–5.1)
SODIUM: 136 mmol/L (ref 135–145)

## 2016-10-03 LAB — CBC
HEMATOCRIT: 34.8 % — AB (ref 39.0–52.0)
HEMOGLOBIN: 11.7 g/dL — AB (ref 13.0–17.0)
MCH: 29.7 pg (ref 26.0–34.0)
MCHC: 33.6 g/dL (ref 30.0–36.0)
MCV: 88.3 fL (ref 78.0–100.0)
Platelets: 277 10*3/uL (ref 150–400)
RBC: 3.94 MIL/uL — AB (ref 4.22–5.81)
RDW: 12.4 % (ref 11.5–15.5)
WBC: 13.2 10*3/uL — AB (ref 4.0–10.5)

## 2016-10-03 LAB — GLUCOSE, CAPILLARY: Glucose-Capillary: 98 mg/dL (ref 65–99)

## 2016-10-03 MED ORDER — DEXTROSE-NACL 5-0.9 % IV SOLN
INTRAVENOUS | Status: AC
Start: 2016-10-03 — End: 2016-10-05
  Administered 2016-10-03 – 2016-10-04 (×2): via INTRAVENOUS

## 2016-10-03 NOTE — Progress Notes (Signed)
Pt came to nurses station with IV unhooked, asked patient how that happened he said he did it and could not get it hooked back up.  Advised patient he is not allowed to be hooking and unhooking his IV and educated why. Patient verbally agreed and tubing was changed.

## 2016-10-03 NOTE — Progress Notes (Signed)
PROGRESS NOTE  Derrick Villa:[id number] DOB: August 24, 1965 DOA: 10/02/2016 PCP: Patient, No Pcp Per   LOS: 1 day   Brief Narrative / Interim history: 51 year old male admitted with severe abdominal pain, found to have acute diverticulitis with perforation and associated abscess.  General surgery and interventional radiology were consulted.  Assessment & Plan: Principal Problem:   Diverticulitis of colon with perforation Active Problems:   Acute lower UTI   Diverticulitis   Sepsis due to acute perforated diverticulitis -Meets criteria for sepsis with white count, tachycardia and a source -Continue Zosyn, appreciate surgery and IR input -Sepsis physiology improving, patient for now is declining abscess drainage and drain placement -Discussed extensively with the patient in the room as well as patient's father regarding our recommendation for drain placement, he is thinking about it but currently not decided what to do  DVT prophylaxis: SCD Code Status: Full code Family Communication: d/w patient's father bedside Disposition Plan: TBD  Consultants:   General surgery  IR  Procedures:   None   Antimicrobials:  Zosyn 9/21 >>   Subjective: -Patient upset about numerous things.  Feels like he is hearing different things from different providers.  Abdominal pain slowly improved  Objective: Vitals:   10/02/16 0638 10/02/16 1434 10/02/16 2142 10/03/16 0603  BP: (!) 142/90 128/78 (!) 138/93 123/81  Pulse: 94 90 93 88  Resp: Temp: 98.3 F (36.8 C) 98.9 F (37.2 C) 98.7 F (37.1 C) 98.9 F (37.2 C)  TempSrc:  Oral    SpO2: 94% 95% 96% 98%  Weight:      Height:        Intake/Output Summary (Last 24 hours) at 10/03/16 1306 Last data filed at 10/03/16 0959  Gross per 24 hour  Intake          2554.16 ml  Output                0 ml  Net          2554.16 ml   Filed Weights   10/02/16 0042  Weight: 99.8 kg (220 lb)    Examination:  Constitutional:  NAD, anxious Eyes: lids and conjunctivae normal Respiratory: CTA Cardiovascular: RRR  Data Reviewed: I have independently reviewed following labs and imaging studies  CBC:  Recent Labs Lab 10/02/16 0044 10/03/16 0553  WBC 21.6* 13.2*  HGB 14.7 11.7*  HCT 41.3 34.8*  MCV 87.1 88.3  PLT 300 277   Basic Metabolic Panel:  Recent Labs Lab 10/02/16 0044 10/03/16 0553  NA 134* 136  K 3.6 3.3*  CL 99* 102  CO2 23 25  GLUCOSE 130* 98  BUN 16 11  CREATININE 1.02 0.87  CALCIUM 9.5 8.7*   GFR: Estimated Creatinine Clearance: 122.9 mL/min (by C-G formula based on SCr of 0.87 mg/dL). Liver Function Tests:  Recent Labs Lab 10/02/16 0044  AST 26  ALT 32  ALKPHOS 87  BILITOT 0.8  PROT 8.8*  ALBUMIN 3.9    Recent Labs Lab 10/02/16 0044  LIPASE 22   No results for input(s): AMMONIA in the last 168 hours. Coagulation Profile:  Recent Labs Lab 10/02/16 0938  INR 1.07   Cardiac Enzymes: No results for input(s): CKTOTAL, CKMB, CKMBINDEX, TROPONINI in the last 168 hours. BNP (last 3 results) No results for input(s): PROBNP in the last 8760 hours. HbA1C: No results for input(s): HGBA1C in the last 72 hours. CBG:  Recent Labs Lab 10/02/16 0803 10/03/16 0006  GLUCAP 141*  98   Lipid Profile: No results for input(s): CHOL, HDL, LDLCALC, TRIG, CHOLHDL, LDLDIRECT in the last 72 hours. Thyroid Function Tests: No results for input(s): TSH, T4TOTAL, FREET4, T3FREE, THYROIDAB in the last 72 hours. Anemia Panel: No results for input(s): VITAMINB12, FOLATE, FERRITIN, TIBC, IRON, RETICCTPCT in the last 72 hours. Urine analysis:    Component Value Date/Time   COLORURINE AMBER (A) 10/02/2016 0135   APPEARANCEUR HAZY (A) 10/02/2016 0135   LABSPEC 1.022 10/02/2016 0135   PHURINE 5.0 10/02/2016 0135   GLUCOSEU NEGATIVE 10/02/2016 0135   HGBUR SMALL (A) 10/02/2016 0135   BILIRUBINUR NEGATIVE 10/02/2016 0135   KETONESUR 5 (A) 10/02/2016 0135   PROTEINUR >=300 (A)  10/02/2016 0135   NITRITE POSITIVE (A) 10/02/2016 0135   LEUKOCYTESUR MODERATE (A) 10/02/2016 0135   Sepsis Labs: Invalid input(s): PROCALCITONIN, LACTICIDVEN  Recent Results (from the past 240 hour(s))  Blood culture (routine x 2)     Status: None (Preliminary result)   Collection Time: 10/02/16  4:29 AM  Result Value Ref Range Status   Specimen Description BLOOD LEFT ARM  Final   Special Requests   Final    BOTTLES DRAWN AEROBIC AND ANAEROBIC Blood Culture adequate volume   Culture   Final    NO GROWTH 1 DAY Performed at [facility], [address], [place], [place] [address]    Report Status PENDING  Incomplete  Blood culture (routine x 2)     Status: None (Preliminary result)   Collection Time: 10/02/16  4:53 AM  Result Value Ref Range Status   Specimen Description BLOOD RIGHT HAND  Final   Special Requests IN PEDIATRIC BOTTLE Blood Culture adequate volume  Final   Culture   Final    NO GROWTH 1 DAY Performed at [facility], [address], [place], [place] [address]    Report Status PENDING  Incomplete  Culture, Urine     Status: None (Preliminary result)   Collection Time: 10/02/16  6:29 PM  Result Value Ref Range Status   Specimen Description URINE, RANDOM  Final   Special Requests   Final    urine culture add on Performed at [facility], [address], [place], [place] [address]    Culture PENDING  Incomplete   Report Status PENDING  Incomplete      Radiology Studies: Ct Abdomen Pelvis W Contrast  Result Date: 10/02/2016 CLINICAL DATA:  Abdominal pain. Sharp rectal pain. Constipation. Elevated white cell count. EXAM: CT ABDOMEN AND PELVIS WITH CONTRAST TECHNIQUE: Multidetector CT imaging of the abdomen and pelvis was performed using the standard protocol following bolus administration of intravenous contrast. CONTRAST:  100 mL Isovue-300 COMPARISON:  08/17/2012 FINDINGS: Lower chest: Lung bases are clear. Focal fluid or scarring in the left  base at the fissure. Hepatobiliary: Mild diffuse fatty infiltration of the liver. No focal liver lesions. Gallbladder and bile ducts are unremarkable. Pancreas: Unremarkable. No pancreatic ductal dilatation or surrounding inflammatory changes. Spleen: Normal in size without focal abnormality. Adrenals/Urinary Tract: Adrenal glands are unremarkable. Kidneys are normal, without renal calculi, focal lesion, or hydronephrosis. Bladder is unremarkable. No bladder wall thickening or intraluminal gas. Stomach/Bowel: Diverticulosis of the sigmoid colon with sigmoid colonic wall thickening and stranding in the pelvic fat consistent with acute diverticulitis. There is a an abscess adjacent to the rectosigmoid junction measuring 5.4 cm diameter. There is diffuse free intra-abdominal air. Stomach and small bowel are unremarkable. Appendix is normal. Vascular/Lymphatic: Aortic atherosclerosis. No enlarged  abdominal or pelvic lymph nodes. Reproductive: Prostate is unremarkable. Other: Abdominal wall musculature appears intact. No free fluid in the abdomen. Musculoskeletal: No acute or significant osseous findings. IMPRESSION: Acute perforated diverticulitis with 5.4 cm abscess in the pelvis and with diffuse free air throughout the abdomen. These results were called by telephone at the time of interpretation on 10/02/2016 at 4:18 am to Dr. Cy Blamer , who verbally acknowledged these results. Electronically Signed   By: Burman Nieves M.D.   On: 10/02/2016 04:25     Scheduled Meds: Continuous Infusions: . dextrose 5 % and 0.9% NaCl 75 mL/hr at 10/03/16 1155  . piperacillin-tazobactam (ZOSYN)  IV 3.375 g (10/03/16 1200)     Time spent: 35 minutes, ~30 minutes in the room in 3 separate visits 2 involved in discussions with the patient, reviewed CT images and discussed plan of care, and 1 visit to update his father, reviewed CT images again and discussed medical team recommendations.    Pamella Pert, MD,  PhD [facility] Pager [phone]  If 7PM-7AM, please contact night-coverage [email or web address] Password [email or web address] 10/03/2016, 1:06 PM

## 2016-10-03 NOTE — Progress Notes (Signed)
Assessment Principal Problem:   Diverticulitis of colon with perforation and pelvic abscess-WBC down; pain about the same; no current signs of sepsis Active Problems:   Acute lower UTI   Diverticulitis   Plan:  I recommend percutaneous drainage to him given that the probability is higher, with the size of his abscess, that this will resolve the situation and avoid urgent colectomy with colostomy (vs abxs alone).  He is not sure what he wants to do. We discussed the possibility of sepsis if the process progressed.    Continue abxs for now.  Would repeat CT on 9/25 if abx alone treatment continues.  If he becomes septic, emergency drainage or operation   LOS: 1 day        Chief Complaint/Subjective: Pain is about the same.  Some abdominal distension.  Objective: Vital signs in last 24 hours: Temp:  [98.7 F (37.1 C)-98.9 F (37.2 C)] 98.9 F (37.2 C) (09/22 0603) Pulse Rate:  [88-93] 88 (09/22 0603) Resp:  [15-16] 16 (09/22 0603) BP: (123-138)/(78-93) 123/81 (09/22 0603) SpO2:  [95 %-98 %] 98 % (09/22 0603) Last BM Date: 09/28/16  Intake/Output from previous day: 09/21 0701 - 09/22 0700 In: 2554.2 [I.V.:2404.2; IV Piggyback:150] Out: -  Intake/Output this shift: No intake/output data recorded.  PE: General- In NAD.  Awake and alert. Abdomen-soft, minimal LLQ tenderness.  Lab Results:   Recent Labs  10/02/16 0044 10/03/16 0553  WBC 21.6* 13.2*  HGB 14.7 11.7*  HCT 41.3 34.8*  PLT 300 277   BMET  Recent Labs  10/02/16 0044 10/03/16 0553  NA 134* 136  K 3.6 3.3*  CL 99* 102  CO2 23 25  GLUCOSE 130* 98  BUN 16 11  CREATININE 1.02 0.87  CALCIUM 9.5 8.7*   PT/INR  Recent Labs  10/02/16 0938  LABPROT 13.8  INR 1.07   Comprehensive Metabolic Panel:    Component Value Date/Time   NA 136 10/03/2016 0553   NA 134 (L) 10/02/2016 0044   K 3.3 (L) 10/03/2016 0553   K 3.6 10/02/2016 0044   CL 102 10/03/2016 0553   CL 99 (L) 10/02/2016 0044   CO2 25  10/03/2016 0553   CO2 23 10/02/2016 0044   BUN 11 10/03/2016 0553   BUN 16 10/02/2016 0044   CREATININE 0.87 10/03/2016 0553   CREATININE 1.02 10/02/2016 0044   GLUCOSE 98 10/03/2016 0553   GLUCOSE 130 (H) 10/02/2016 0044   CALCIUM 8.7 (L) 10/03/2016 0553   CALCIUM 9.5 10/02/2016 0044   AST 26 10/02/2016 0044   AST 84 (H) 08/16/2012 2148   ALT 32 10/02/2016 0044   ALT 79 (H) 08/16/2012 2148   ALKPHOS 87 10/02/2016 0044   ALKPHOS 59 08/16/2012 2148   BILITOT 0.8 10/02/2016 0044   BILITOT 0.1 (L) 08/16/2012 2148   PROT 8.8 (H) 10/02/2016 0044   PROT 5.8 (L) 08/16/2012 2148   ALBUMIN 3.9 10/02/2016 0044   ALBUMIN 3.2 (L) 08/16/2012 2148     Studies/Results: Ct Abdomen Pelvis W Contrast  Result Date: 10/02/2016 CLINICAL DATA:  Abdominal pain. Sharp rectal pain. Constipation. Elevated white cell count. EXAM: CT ABDOMEN AND PELVIS WITH CONTRAST TECHNIQUE: Multidetector CT imaging of the abdomen and pelvis was performed using the standard protocol following bolus administration of intravenous contrast. CONTRAST:  100 mL Isovue-300 COMPARISON:  08/17/2012 FINDINGS: Lower chest: Lung bases are clear. Focal fluid or scarring in the left base at the fissure. Hepatobiliary: Mild diffuse fatty infiltration of the liver. No focal  liver lesions. Gallbladder and bile ducts are unremarkable. Pancreas: Unremarkable. No pancreatic ductal dilatation or surrounding inflammatory changes. Spleen: Normal in size without focal abnormality. Adrenals/Urinary Tract: Adrenal glands are unremarkable. Kidneys are normal, without renal calculi, focal lesion, or hydronephrosis. Bladder is unremarkable. No bladder wall thickening or intraluminal gas. Stomach/Bowel: Diverticulosis of the sigmoid colon with sigmoid colonic wall thickening and stranding in the pelvic fat consistent with acute diverticulitis. There is a an abscess adjacent to the rectosigmoid junction measuring 5.4 cm diameter. There is diffuse free  intra-abdominal air. Stomach and small bowel are unremarkable. Appendix is normal. Vascular/Lymphatic: Aortic atherosclerosis. No enlarged abdominal or pelvic lymph nodes. Reproductive: Prostate is unremarkable. Other: Abdominal wall musculature appears intact. No free fluid in the abdomen. Musculoskeletal: No acute or significant osseous findings. IMPRESSION: Acute perforated diverticulitis with 5.4 cm abscess in the pelvis and with diffuse free air throughout the abdomen. These results were called by telephone at the time of interpretation on 10/02/2016 at 4:18 am to Dr. Cy Blamer , who verbally acknowledged these results. Electronically Signed   By: Burman Nieves M.D.   On: 10/02/2016 04:25    Anti-infectives: Anti-infectives    Start     Dose/Rate Route Frequency Ordered Stop   10/02/16 1200  piperacillin-tazobactam (ZOSYN) IVPB 3.375 g     3.375 g 12.5 mL/hr over 240 Minutes Intravenous Every 8 hours 10/02/16 0601     10/02/16 0430  vancomycin (VANCOCIN) IVPB 1000 mg/200 mL premix     1,000 mg 200 mL/hr over 60 Minutes Intravenous  Once 10/02/16 0426 10/02/16 0601   10/02/16 0430  piperacillin-tazobactam (ZOSYN) IVPB 3.375 g     3.375 g 100 mL/hr over 30 Minutes Intravenous  Once 10/02/16 0426 10/02/16 0529   10/02/16 0430  doxycycline (VIBRAMYCIN) 100 mg in dextrose 5 % 250 mL IVPB  Status:  Discontinued     100 mg 125 mL/hr over 120 Minutes Intravenous  Once 10/02/16 0426 10/02/16 0553   10/02/16 0215  cefTRIAXone (ROCEPHIN) injection 1 g     1 g Intramuscular  Once 10/02/16 0209 10/02/16 0227       Antavion Bartoszek J 10/03/2016

## 2016-10-04 LAB — CBC
HCT: 36.2 % — ABNORMAL LOW (ref 39.0–52.0)
Hemoglobin: 12.5 g/dL — ABNORMAL LOW (ref 13.0–17.0)
MCH: 30.3 pg (ref 26.0–34.0)
MCHC: 34.5 g/dL (ref 30.0–36.0)
MCV: 87.7 fL (ref 78.0–100.0)
PLATELETS: 294 10*3/uL (ref 150–400)
RBC: 4.13 MIL/uL — ABNORMAL LOW (ref 4.22–5.81)
RDW: 12.3 % (ref 11.5–15.5)
WBC: 16.2 10*3/uL — AB (ref 4.0–10.5)

## 2016-10-04 MED ORDER — IOPAMIDOL (ISOVUE-300) INJECTION 61%
INTRAVENOUS | Status: AC
  Filled 2016-10-04: qty 100

## 2016-10-04 NOTE — Progress Notes (Signed)
Pt. refused to have CT done today as ordered. Explained to pt. that the Dr. had received lab results and wanted it done today, but he continued to refuse and states he will have it tomorrow.

## 2016-10-04 NOTE — Progress Notes (Signed)
PROGRESS NOTE  FELDER LEBEDA JXB:[id number] DOB: [birth year] DOA: 10/02/2016 PCP: Patient, No Pcp Per   LOS: 2 days   Brief Narrative / Interim history: 51 year old male admitted with severe abdominal pain, found to have acute diverticulitis with perforation and associated abscess.  General surgery and interventional radiology were consulted.  Assessment & Plan: Principal Problem:   Diverticulitis of colon with perforation Active Problems:   Acute lower UTI   Diverticulitis   Sepsis due to acute perforated diverticulitis -Meets criteria for sepsis with white count, tachycardia and a source -Continue Zosyn, appreciate surgery and IR input -Sepsis physiology improving, patient for now is declining abscess drainage and drain placement -will have a CT scan follow up tomorrow, does not want drain until CT scan is done  DVT prophylaxis: SCD Code Status: Full code Family Communication: d/w patient's father bedside Disposition Plan: TBD  Consultants:   General surgery  IR  Procedures:   None   Antimicrobials:  Zosyn 9/21 >>   Subjective: - no chest pain, shortness of breath, + abdominal pain, no nausea or vomiting.   Objective: Vitals:   10/03/16 0603 10/03/16 1318 10/03/16 2037 10/04/16 0714  BP: 123/81 (!) 154/84 (!) 151/83 (!) 143/86  Pulse: 88 91 90 89  Resp: Temp: 98.9 F (37.2 C) 98.6 F (37 C) 98.7 F (37.1 C) 98 F (36.7 C)  TempSrc:  Oral  Oral  SpO2: 98% 99% 96% 97%  Weight:      Height:        Intake/Output Summary (Last 24 hours) at 10/04/16 1302 Last data filed at 10/04/16 1220  Gross per 24 hour  Intake          1986.25 ml  Output                0 ml  Net          1986.25 ml   Filed Weights   10/02/16 0042  Weight: 99.8 kg (220 lb)    Examination:  Constitutional: NAD Respiratory: CTA Cardiovascular: RRR  Data Reviewed: I have independently reviewed following labs and imaging studies  CBC:  Recent Labs Lab  10/02/16 0044 10/03/16 0553 10/04/16 1035  WBC 21.6* 13.2* 16.2*  HGB 14.7 11.7* 12.5*  HCT 41.3 34.8* 36.2*  MCV 87.1 88.3 87.7  PLT 300 277 294   Basic Metabolic Panel:  Recent Labs Lab 10/02/16 0044 10/03/16 0553  NA 134* 136  K 3.6 3.3*  CL 99* 102  CO2 23 25  GLUCOSE 130* 98  BUN 16 11  CREATININE 1.02 0.87  CALCIUM 9.5 8.7*   GFR: Estimated Creatinine Clearance: 122.9 mL/min (by C-G formula based on SCr of 0.87 mg/dL). Liver Function Tests:  Recent Labs Lab 10/02/16 0044  AST 26  ALT 32  ALKPHOS 87  BILITOT 0.8  PROT 8.8*  ALBUMIN 3.9    Recent Labs Lab 10/02/16 0044  LIPASE 22   No results for input(s): AMMONIA in the last 168 hours. Coagulation Profile:  Recent Labs Lab 10/02/16 0938  INR 1.07   Cardiac Enzymes: No results for input(s): CKTOTAL, CKMB, CKMBINDEX, TROPONINI in the last 168 hours. BNP (last 3 results) No results for input(s): PROBNP in the last 8760 hours. HbA1C: No results for input(s): HGBA1C in the last 72 hours. CBG:  Recent Labs Lab 10/02/16 0803 10/03/16 0006  GLUCAP 141* 98   Lipid Profile: No results for input(s): CHOL, HDL, LDLCALC, TRIG, CHOLHDL, LDLDIRECT in the last  72 hours. Thyroid Function Tests: No results for input(s): TSH, T4TOTAL, FREET4, T3FREE, THYROIDAB in the last 72 hours. Anemia Panel: No results for input(s): VITAMINB12, FOLATE, FERRITIN, TIBC, IRON, RETICCTPCT in the last 72 hours. Urine analysis:    Component Value Date/Time   COLORURINE AMBER (A) 10/02/2016 0135   APPEARANCEUR HAZY (A) 10/02/2016 0135   LABSPEC 1.022 10/02/2016 0135   PHURINE 5.0 10/02/2016 0135   GLUCOSEU NEGATIVE 10/02/2016 0135   HGBUR SMALL (A) 10/02/2016 0135   BILIRUBINUR NEGATIVE 10/02/2016 0135   KETONESUR 5 (A) 10/02/2016 0135   PROTEINUR >=300 (A) 10/02/2016 0135   NITRITE POSITIVE (A) 10/02/2016 0135   LEUKOCYTESUR MODERATE (A) 10/02/2016 0135   Sepsis Labs: Invalid input(s): PROCALCITONIN,  LACTICIDVEN  Recent Results (from the past 240 hour(s))  Blood culture (routine x 2)     Status: None (Preliminary result)   Collection Time: 10/02/16  4:29 AM  Result Value Ref Range Status   Specimen Description BLOOD LEFT ARM  Final   Special Requests   Final    BOTTLES DRAWN AEROBIC AND ANAEROBIC Blood Culture adequate volume   Culture   Final    NO GROWTH 1 DAY Performed at [facility], [address], [place], [place] [address]    Report Status PENDING  Incomplete  Blood culture (routine x 2)     Status: None (Preliminary result)   Collection Time: 10/02/16  4:53 AM  Result Value Ref Range Status   Specimen Description BLOOD RIGHT HAND  Final   Special Requests IN PEDIATRIC BOTTLE Blood Culture adequate volume  Final   Culture   Final    NO GROWTH 1 DAY Performed at [facility], [address], [place], [place] [address]    Report Status PENDING  Incomplete  Culture, Urine     Status: Abnormal (Preliminary result)   Collection Time: 10/02/16  6:29 PM  Result Value Ref Range Status   Specimen Description URINE, RANDOM  Final   Special Requests urine culture add on  Final   Culture (A)  Final    >=100,000 COLONIES/mL ESCHERICHIA COLI SUSCEPTIBILITIES TO FOLLOW Performed at [facility], [address] [place], [place] [address]    Report Status PENDING  Incomplete      Radiology Studies: No results found.   Scheduled Meds: Continuous Infusions: . dextrose 5 % and 0.9% NaCl 75 mL/hr at 10/04/16 0600  . piperacillin-tazobactam (ZOSYN)  IV 3.375 g (10/04/16 1107)    Pamella Pert, MD, PhD [facility] Pager [phone]  If 7PM-7AM, please contact night-coverage [email or web address] Password [email or web address] 10/04/2016, 1:02 PM

## 2016-10-04 NOTE — Progress Notes (Addendum)
Assessment Principal Problem:   Diverticulitis of colon with perforation and pelvic abscess-he is better this AM; WBC pending.     Plan:  Liquid diet. Continue abxs.  Repeat CT on 9/25.  If abscess the same or larger-> percutaneous drain.  He seems more amenable to this now.  Addendum:  WBC is up to 16,200 today, will get CT today.    LOS: 2 days        Chief Complaint/Subjective: Feels better overall with less pain.  Some loss BMs with no blood.  Objective: Vital signs in last 24 hours: Temp:  [98 F (36.7 C)-98.7 F (37.1 C)] 98 F (36.7 C) (09/23 0714) Pulse Rate:  [89-91] 89 (09/23 0714) Resp:  [15-16] 16 (09/23 0714) BP: (143-154)/(83-86) 143/86 (09/23 0714) SpO2:  [96 %-99 %] 97 % (09/23 0714) Last BM Date: 10/04/16  Intake/Output from previous day: 09/22 0701 - 09/23 0700 In: 1506.3 [I.V.:1356.3; IV Piggyback:150] Out: -  Intake/Output this shift: No intake/output data recorded.  PE: General- In NAD.  Awake and alert. Abdomen-soft, minimal LLQ tenderness-unchanged.  Lab Results:   Recent Labs  10/02/16 0044 10/03/16 0553  WBC 21.6* 13.2*  HGB 14.7 11.7*  HCT 41.3 34.8*  PLT 300 277   BMET  Recent Labs  10/02/16 0044 10/03/16 0553  NA 134* 136  K 3.6 3.3*  CL 99* 102  CO2 23 25  GLUCOSE 130* 98  BUN 16 11  CREATININE 1.02 0.87  CALCIUM 9.5 8.7*   PT/INR  Recent Labs  10/02/16 0938  LABPROT 13.8  INR 1.07   Comprehensive Metabolic Panel:    Component Value Date/Time   NA 136 10/03/2016 0553   NA 134 (L) 10/02/2016 0044   K 3.3 (L) 10/03/2016 0553   K 3.6 10/02/2016 0044   CL 102 10/03/2016 0553   CL 99 (L) 10/02/2016 0044   CO2 25 10/03/2016 0553   CO2 23 10/02/2016 0044   BUN 11 10/03/2016 0553   BUN 16 10/02/2016 0044   CREATININE 0.87 10/03/2016 0553   CREATININE 1.02 10/02/2016 0044   GLUCOSE 98 10/03/2016 0553   GLUCOSE 130 (H) 10/02/2016 0044   CALCIUM 8.7 (L) 10/03/2016 0553   CALCIUM 9.5 10/02/2016 0044   AST  26 10/02/2016 0044   AST 84 (H) 08/16/2012 2148   ALT 32 10/02/2016 0044   ALT 79 (H) 08/16/2012 2148   ALKPHOS 87 10/02/2016 0044   ALKPHOS 59 08/16/2012 2148   BILITOT 0.8 10/02/2016 0044   BILITOT 0.1 (L) 08/16/2012 2148   PROT 8.8 (H) 10/02/2016 0044   PROT 5.8 (L) 08/16/2012 2148   ALBUMIN 3.9 10/02/2016 0044   ALBUMIN 3.2 (L) 08/16/2012 2148     Studies/Results: No results found.  Anti-infectives: Anti-infectives    Start     Dose/Rate Route Frequency Ordered Stop   10/02/16 1200  piperacillin-tazobactam (ZOSYN) IVPB 3.375 g     3.375 g 12.5 mL/hr over 240 Minutes Intravenous Every 8 hours 10/02/16 0601     10/02/16 0430  vancomycin (VANCOCIN) IVPB 1000 mg/200 mL premix     1,000 mg 200 mL/hr over 60 Minutes Intravenous  Once 10/02/16 0426 10/02/16 0601   10/02/16 0430  piperacillin-tazobactam (ZOSYN) IVPB 3.375 g     3.375 g 100 mL/hr over 30 Minutes Intravenous  Once 10/02/16 0426 10/02/16 0529   10/02/16 0430  doxycycline (VIBRAMYCIN) 100 mg in dextrose 5 % 250 mL IVPB  Status:  Discontinued     100 mg 125 mL/hr  over 120 Minutes Intravenous  Once 10/02/16 0426 10/02/16 0553   10/02/16 0215  cefTRIAXone (ROCEPHIN) injection 1 g     1 g Intramuscular  Once 10/02/16 0209 10/02/16 0227       Candice Lunney J 10/04/2016

## 2016-10-05 ENCOUNTER — Inpatient Hospital Stay (HOSPITAL_COMMUNITY): Payer: Self-pay

## 2016-10-05 LAB — CBC
HCT: 33.7 % — ABNORMAL LOW (ref 39.0–52.0)
Hemoglobin: 11.4 g/dL — ABNORMAL LOW (ref 13.0–17.0)
MCH: 29.8 pg (ref 26.0–34.0)
MCHC: 33.8 g/dL (ref 30.0–36.0)
MCV: 88.2 fL (ref 78.0–100.0)
PLATELETS: 300 10*3/uL (ref 150–400)
RBC: 3.82 MIL/uL — ABNORMAL LOW (ref 4.22–5.81)
RDW: 12.5 % (ref 11.5–15.5)
WBC: 15.9 10*3/uL — AB (ref 4.0–10.5)

## 2016-10-05 LAB — URINE CULTURE: Culture: >=100000 — AB

## 2016-10-05 LAB — BASIC METABOLIC PANEL
Anion gap: 8 (ref 5–15)
BUN: 6 mg/dL (ref 6–20)
CHLORIDE: 102 mmol/L (ref 101–111)
CO2: 26 mmol/L (ref 22–32)
CREATININE: 0.78 mg/dL (ref 0.61–1.24)
Calcium: 8.5 mg/dL — ABNORMAL LOW (ref 8.9–10.3)
GFR calc Af Amer: >60 mL/min (ref >60)
GFR calc non Af Amer: >60 mL/min (ref >60)
Glucose, Bld: 174 mg/dL — ABNORMAL HIGH (ref 65–99)
Potassium: 3.3 mmol/L — ABNORMAL LOW (ref 3.5–5.1)
SODIUM: 136 mmol/L (ref 135–145)

## 2016-10-05 LAB — GC/CHLAMYDIA PROBE AMP (~~LOC~~) NOT AT ARMC
CHLAMYDIA, DNA PROBE: NEGATIVE
NEISSERIA GONORRHEA: NEGATIVE

## 2016-10-05 MED ORDER — IOPAMIDOL (ISOVUE-300) INJECTION 61%
100.0000 mL | Freq: Once | INTRAVENOUS | Status: AC | PRN
  Administered 2016-10-05: 11:00:00 100 mL via INTRAVENOUS

## 2016-10-05 MED ORDER — POTASSIUM CHLORIDE CRYS ER 20 MEQ PO TBCR
20.0000 meq | EXTENDED_RELEASE_TABLET | Freq: Two times a day (BID) | ORAL | Status: AC
  Administered 2016-10-05 (×2): 20 meq via ORAL
  Filled 2016-10-05 (×2): qty 1

## 2016-10-05 MED ORDER — DEXTROSE-NACL 5-0.9 % IV SOLN
INTRAVENOUS | Status: AC
  Administered 2016-10-05 – 2016-10-07 (×4): via INTRAVENOUS

## 2016-10-05 MED ORDER — IOPAMIDOL (ISOVUE-300) INJECTION 61%
INTRAVENOUS | Status: AC
  Filled 2016-10-05: qty 100

## 2016-10-05 MED ORDER — NALOXONE HCL 0.4 MG/ML IJ SOLN
INTRAMUSCULAR | Status: AC
  Filled 2016-10-05: qty 1

## 2016-10-05 NOTE — Progress Notes (Signed)
PROGRESS NOTE  Derrick Villa ZOX:[id number] DOB: 1965-07-20 DOA: 10/02/2016 PCP: Patient, No Pcp Per   LOS: 3 days   Brief Narrative / Interim history: 51 year old male admitted with severe abdominal pain, found to have acute diverticulitis with perforation and associated abscess.  General surgery and interventional radiology were consulted.  Assessment & Plan: Principal Problem:   Diverticulitis of colon with perforation Active Problems:   Acute lower UTI   Diverticulitis   Sepsis due to acute perforated diverticulitis -Meets criteria for sepsis with white count, tachycardia and a source -Continue Zosyn, appreciate surgery and IR input -Sepsis physiology improving, patient for now is declining abscess drainage and drain placement -Follow-up CT scan today pending  ?  UTI -Patient without urinary symptoms, urine culture is growing however E. coli which is pansensitive.  On Zosyn for above  DVT prophylaxis: SCD Code Status: Full code Family Communication: No family at bedside this morning Disposition Plan: TBD  Consultants:   General surgery  IR  Procedures:   None   Antimicrobials:  Zosyn 9/21 >>   Subjective: -His abdominal pain is gotten a little bit worse, no nausea or vomiting  Objective: Vitals:   10/04/16 0714 10/04/16 1336 10/04/16 2301 10/05/16 0559  BP: (!) 143/86 (!) 143/83 130/68 113/63  Pulse: 89 88 84 84  Resp: Temp: 98 F (36.7 C) 98 F (36.7 C) 98.9 F (37.2 C) 98.5 F (36.9 C)  TempSrc: Oral Oral Oral Oral  SpO2: 97% 96% 98% 98%  Weight:      Height:        Intake/Output Summary (Last 24 hours) at 10/05/16 1103 Last data filed at 10/05/16 8119  Gross per 24 hour  Intake             3390 ml  Output                0 ml  Net             3390 ml   Filed Weights   10/02/16 0042  Weight: 99.8 kg (220 lb)    Examination:  Constitutional: NAD Respiratory: CTA Cardiovascular: RRR  Data Reviewed: I have independently  reviewed following labs and imaging studies  CBC:  Recent Labs Lab 10/02/16 0044 10/03/16 0553 10/04/16 1035 10/05/16 0515  WBC 21.6* 13.2* 16.2* 15.9*  HGB 14.7 11.7* 12.5* 11.4*  HCT 41.3 34.8* 36.2* 33.7*  MCV 87.1 88.3 87.7 88.2  PLT 300 277 294 300   Basic Metabolic Panel:  Recent Labs Lab 10/02/16 0044 10/03/16 0553 10/05/16 0515  NA 134* 136 136  K 3.6 3.3* 3.3*  CL 99* 102 102  CO2 GLUCOSE 130* 98 174*  BUN CREATININE 1.02 0.87 0.78  CALCIUM 9.5 8.7* 8.5*   GFR: Estimated Creatinine Clearance: 133.7 mL/min (by C-G formula based on SCr of 0.78 mg/dL). Liver Function Tests:  Recent Labs Lab 10/02/16 0044  AST 26  ALT 32  ALKPHOS 87  BILITOT 0.8  PROT 8.8*  ALBUMIN 3.9    Recent Labs Lab 10/02/16 0044  LIPASE 22   No results for input(s): AMMONIA in the last 168 hours. Coagulation Profile:  Recent Labs Lab 10/02/16 0938  INR 1.07   Cardiac Enzymes: No results for input(s): CKTOTAL, CKMB, CKMBINDEX, TROPONINI in the last 168 hours. BNP (last 3 results) No results for input(s): PROBNP in the last 8760 hours. HbA1C: No results for input(s): HGBA1C in the  last 72 hours. CBG:  Recent Labs Lab 10/02/16 0803 10/03/16 0006  GLUCAP 141* 98   Lipid Profile: No results for input(s): CHOL, HDL, LDLCALC, TRIG, CHOLHDL, LDLDIRECT in the last 72 hours. Thyroid Function Tests: No results for input(s): TSH, T4TOTAL, FREET4, T3FREE, THYROIDAB in the last 72 hours. Anemia Panel: No results for input(s): VITAMINB12, FOLATE, FERRITIN, TIBC, IRON, RETICCTPCT in the last 72 hours. Urine analysis:    Component Value Date/Time   COLORURINE AMBER (A) 10/02/2016 0135   APPEARANCEUR HAZY (A) 10/02/2016 0135   LABSPEC 1.022 10/02/2016 0135   PHURINE 5.0 10/02/2016 0135   GLUCOSEU NEGATIVE 10/02/2016 0135   HGBUR SMALL (A) 10/02/2016 0135   BILIRUBINUR NEGATIVE 10/02/2016 0135   KETONESUR 5 (A) 10/02/2016 0135   PROTEINUR >=300 (A)  10/02/2016 0135   NITRITE POSITIVE (A) 10/02/2016 0135   LEUKOCYTESUR MODERATE (A) 10/02/2016 0135   Sepsis Labs: Invalid input(s): PROCALCITONIN, LACTICIDVEN  Recent Results (from the past 240 hour(s))  Blood culture (routine x 2)     Status: None (Preliminary result)   Collection Time: 10/02/16  4:29 AM  Result Value Ref Range Status   Specimen Description BLOOD LEFT ARM  Final   Special Requests   Final    BOTTLES DRAWN AEROBIC AND ANAEROBIC Blood Culture adequate volume   Culture   Final    NO GROWTH 2 DAYS Performed at [facility], [address] [place], [place] [address]    Report Status PENDING  Incomplete  Blood culture (routine x 2)     Status: None (Preliminary result)   Collection Time: 10/02/16  4:53 AM  Result Value Ref Range Status   Specimen Description BLOOD RIGHT HAND  Final   Special Requests IN PEDIATRIC BOTTLE Blood Culture adequate volume  Final   Culture   Final    NO GROWTH 2 DAYS Performed at [facility], [address], [place], [place] [address]    Report Status PENDING  Incomplete  Culture, Urine     Status: Abnormal   Collection Time: 10/02/16  6:29 PM  Result Value Ref Range Status   Specimen Description URINE, RANDOM  Final   Special Requests   Final    urine culture add on Performed at [facility], [address], [place], [place] [address]    Culture >=100,000 COLONIES/mL ESCHERICHIA COLI (A)  Final   Report Status 10/05/2016 FINAL  Final   Organism ID, Bacteria ESCHERICHIA COLI (A)  Final      Susceptibility   Escherichia coli - MIC*    AMPICILLIN <=2 SENSITIVE Sensitive     CEFAZOLIN <=4 SENSITIVE Sensitive     CEFTRIAXONE <=1 SENSITIVE Sensitive     CIPROFLOXACIN <=0.25 SENSITIVE Sensitive     GENTAMICIN <=1 SENSITIVE Sensitive     IMIPENEM <=0.25 SENSITIVE Sensitive     NITROFURANTOIN <=16 SENSITIVE Sensitive     TRIMETH/SULFA <=20 SENSITIVE Sensitive     AMPICILLIN/SULBACTAM <=2 SENSITIVE Sensitive      PIP/TAZO <=4 SENSITIVE Sensitive     Extended ESBL NEGATIVE Sensitive     * >=100,000 COLONIES/mL ESCHERICHIA COLI      Radiology Studies: No results found.   Scheduled Meds: . naloxone      . potassium chloride  20 mEq Oral BID   Continuous Infusions: . piperacillin-tazobactam (ZOSYN)  IV Stopped (10/05/16 9147)    Pamella Pert, MD, PhD [facility] Pager [phone]  If 7PM-7AM, please contact night-coverage [email or web address] Password [facility] 10/05/2016,  11:03 AM

## 2016-10-05 NOTE — Progress Notes (Signed)
Pharmacy Antibiotic Note  Derrick Villa is a 51 y.o. male with abdominal pain and constipation admitted on 10/02/2016; found to have sepsis d/t perforated diverticulitis with abscess and UTI.  Pharmacy has been consulted for zosyn dosing.  Today, 10/05/2016:  WBC improving, but slowly  Remains afebrile  Abscess larger on CT today  Pt agrees to IR drainage placement for abscess  Plan:   Continue Zosyn 3.375g IV q8h (4 hour infusion).   Pharmacy will follow peripherally for renal and Cx adjustments  Height: 6' (182.9 cm) Weight: 220 lb (99.8 kg) IBW/kg (Calculated) : 77.6  Temp (24hrs), Avg:98.9 F (37.2 C), Min:98.5 F (36.9 C), Max:99.3 F (37.4 C)   Recent Labs Lab 10/02/16 0044 10/03/16 0553 10/04/16 1035 10/05/16 0515  WBC 21.6* 13.2* 16.2* 15.9*  CREATININE 1.02 0.87  --  0.78    Estimated Creatinine Clearance: 133.7 mL/min (by C-G formula based on SCr of 0.78 mg/dL).    No Known Allergies  Antimicrobials this admission: 9/21 rocephin >> x1 ED 9/21 vancomycin >> x1 ED 9/21 zosyn >>   Microbiology results: 9/21 BCx: ng2d 9/21 UCx: > 100k E.coli pansensitive 9/21 GC/Chlamydia: pending 9/21 HIV: IP   Thank you for allowing pharmacy to be a part of this patient's care.  Bernadene Person, PharmD, BCPS Pager: [phone] 10/05/2016, 2:28 PM

## 2016-10-05 NOTE — Progress Notes (Signed)
Patient ID: Derrick Villa, male   DOB: [birth year], 51 y.o.   MRN: [id number]  [facility] Surgery Progress Note     Subjective: CC- abdominal pain Patient states that he feels about the same as yesterday. Continues to have LLQ and suprapubic pain. It is sharp. Worse when he has a BM. He also reports persistent abdominal distension and nausea, no emesis.  Going for repeat CT scan today.  Objective: Vital signs in last 24 hours: Temp:  [98 F (36.7 C)-98.9 F (37.2 C)] 98.5 F (36.9 C) (09/24 0559) Pulse Rate:  [84-88] 84 (09/24 0559) Resp:  [13-15] 13 (09/24 0559) BP: (113-143)/(63-83) 113/63 (09/24 0559) SpO2:  [96 %-98 %] 98 % (09/24 0559) Last BM Date: 10/04/16  Intake/Output from previous day: 09/23 0701 - 09/24 0700 In: 3390 [P.O.:1440; I.V.:1800; IV Piggyback:150] Out: -  Intake/Output this shift: No intake/output data recorded.  PE: Gen:  Alert, NAD, cooperative HEENT: EOM's intact, pupils equal and round Card:  RRR, no M/G/R heard Pulm:  CTAB, no W/R/R, effort normal Abd: Soft, distended, +BS, no HSM, no hernia, moderate TTP suprapubic region and LLQ with no rebound Ext:  No erythema, edema, or tenderness BUE/BLE  Psych: A&Ox3  Skin: no rashes noted, warm and dry  Lab Results:   Recent Labs  10/04/16 1035 10/05/16 0515  WBC 16.2* 15.9*  HGB 12.5* 11.4*  HCT 36.2* 33.7*  PLT 294 300   BMET  Recent Labs  10/03/16 0553 10/05/16 0515  NA 136 136  K 3.3* 3.3*  CL 102 102  CO2 25 26  GLUCOSE 98 174*  BUN 11 6  CREATININE 0.87 0.78  CALCIUM 8.7* 8.5*   PT/INR  Recent Labs  10/02/16 0938  LABPROT 13.8  INR 1.07   CMP     Component Value Date/Time   NA 136 10/05/2016 0515   K 3.3 (L) 10/05/2016 0515   CL 102 10/05/2016 0515   CO2 26 10/05/2016 0515   GLUCOSE 174 (H) 10/05/2016 0515   BUN 6 10/05/2016 0515   CREATININE 0.78 10/05/2016 0515   CALCIUM 8.5 (L) 10/05/2016 0515   PROT 8.8 (H) 10/02/2016 0044   ALBUMIN 3.9 10/02/2016  0044   AST 26 10/02/2016 0044   ALT 32 10/02/2016 0044   ALKPHOS 87 10/02/2016 0044   BILITOT 0.8 10/02/2016 0044   GFRNONAA >60 10/05/2016 0515   GFRAA >60 10/05/2016 0515   Lipase     Component Value Date/Time   LIPASE 22 10/02/2016 0044       Studies/Results: No results found.  Anti-infectives: Anti-infectives    Start     Dose/Rate Route Frequency Ordered Stop   10/02/16 1200  piperacillin-tazobactam (ZOSYN) IVPB 3.375 g     3.375 g 12.5 mL/hr over 240 Minutes Intravenous Every 8 hours 10/02/16 0601     10/02/16 0430  vancomycin (VANCOCIN) IVPB 1000 mg/200 mL premix     1,000 mg 200 mL/hr over 60 Minutes Intravenous  Once 10/02/16 0426 10/02/16 0601   10/02/16 0430  piperacillin-tazobactam (ZOSYN) IVPB 3.375 g     3.375 g 100 mL/hr over 30 Minutes Intravenous  Once 10/02/16 0426 10/02/16 0529   10/02/16 0430  doxycycline (VIBRAMYCIN) 100 mg in dextrose 5 % 250 mL IVPB  Status:  Discontinued     100 mg 125 mL/hr over 120 Minutes Intravenous  Once 10/02/16 0426 10/02/16 0553   10/02/16 0215  cefTRIAXone (ROCEPHIN) injection 1 g     1 g Intramuscular  Once 10/02/16  0209 10/02/16 0227       Assessment/Plan UTI - Ucx 9/21 growing E coli Constipation  Hx of tobacco/ETOH use Hypokalemia - K 3.3, give oral potassium  Sigmoid diverticulitis with perforation and abscess - CT scan 9/21 showed Acute perforated diverticulitis with 5.4 cm abscess in the pelvis and with diffuse free air throughout the abdomen, patient declined IR drainage at that time  ID - zosyn 9/21>>day#4 FEN - IVF, full liquids VTE - SCDs Foley - none  Plan:  WBC still elevated 15.9. Getting repeat CT scan today, may need IR drainage.    LOS: 3 days    Franne Forts , [facility] Surgery 10/05/2016, 7:41 AM Pager: [phone] Consults: [phone] Mon-Fri 7:00 am-4:30 pm Sat-Sun 7:00 am-11:30 am

## 2016-10-05 NOTE — Progress Notes (Signed)
Patient ID: Derrick Villa, male   DOB: October 23, 1965, 51 y.o.   MRN: [id number] Pt has decided to proceed with CT guided drainage of enlarging pelvic abscess, but not until tomorrow. See original consult note on 9/21 for additional details of hx. Risks and benefits discussed with the patient including bleeding, infection, damage to adjacent structures, bowel perforation/fistula connection, and sepsis.All of the patient's questions were answered, patient is agreeable to proceed.Consent signed and in chart.

## 2016-10-06 ENCOUNTER — Inpatient Hospital Stay (HOSPITAL_COMMUNITY): Payer: Self-pay

## 2016-10-06 ENCOUNTER — Encounter (HOSPITAL_COMMUNITY): Payer: Self-pay | Admitting: Radiology

## 2016-10-06 LAB — CBC
HCT: 32.1 % — ABNORMAL LOW (ref 39.0–52.0)
HEMOGLOBIN: 10.9 g/dL — AB (ref 13.0–17.0)
MCH: 29.9 pg (ref 26.0–34.0)
MCHC: 34 g/dL (ref 30.0–36.0)
MCV: 88.2 fL (ref 78.0–100.0)
Platelets: 342 10*3/uL (ref 150–400)
RBC: 3.64 MIL/uL — ABNORMAL LOW (ref 4.22–5.81)
RDW: 12.5 % (ref 11.5–15.5)
WBC: 14.3 10*3/uL — ABNORMAL HIGH (ref 4.0–10.5)

## 2016-10-06 LAB — BASIC METABOLIC PANEL
Anion gap: 8 (ref 5–15)
CALCIUM: 8.8 mg/dL — AB (ref 8.9–10.3)
CHLORIDE: 105 mmol/L (ref 101–111)
CO2: 27 mmol/L (ref 22–32)
CREATININE: 0.7 mg/dL (ref 0.61–1.24)
GFR calc Af Amer: >60 mL/min (ref >60)
GFR calc non Af Amer: >60 mL/min (ref >60)
Glucose, Bld: 109 mg/dL — ABNORMAL HIGH (ref 65–99)
Potassium: 3.6 mmol/L (ref 3.5–5.1)
SODIUM: 140 mmol/L (ref 135–145)

## 2016-10-06 MED ORDER — LIDOCAINE-EPINEPHRINE 1 %-1:100000 IJ SOLN
INTRAMUSCULAR | Status: AC | PRN
  Administered 2016-10-06: 10 mL via INTRADERMAL

## 2016-10-06 MED ORDER — OXYCODONE-ACETAMINOPHEN 5-325 MG PO TABS
1.0000 | ORAL_TABLET | ORAL | Status: DC | PRN
  Administered 2016-10-08 (×2): 2 via ORAL
  Filled 2016-10-06 (×2): qty 2

## 2016-10-06 MED ORDER — MIDAZOLAM HCL 2 MG/2ML IJ SOLN
INTRAMUSCULAR | Status: AC
  Filled 2016-10-06: qty 4

## 2016-10-06 MED ORDER — FENTANYL CITRATE (PF) 100 MCG/2ML IJ SOLN
INTRAMUSCULAR | Status: AC | PRN
  Administered 2016-10-06 (×4): 50 ug via INTRAVENOUS

## 2016-10-06 MED ORDER — MIDAZOLAM HCL 2 MG/2ML IJ SOLN
INTRAMUSCULAR | Status: AC | PRN
  Administered 2016-10-06 (×4): 1 mg via INTRAVENOUS

## 2016-10-06 MED ORDER — FENTANYL CITRATE (PF) 100 MCG/2ML IJ SOLN
INTRAMUSCULAR | Status: AC
Start: 2016-10-06 — End: 2016-10-07
  Filled 2016-10-06: qty 4

## 2016-10-06 MED ORDER — HYDROMORPHONE HCL 1 MG/ML IJ SOLN
INTRAMUSCULAR | Status: AC
  Administered 2016-10-06: 12:00:00 1 mg
  Filled 2016-10-06: qty 1

## 2016-10-06 NOTE — Progress Notes (Signed)
Patient ID: Derrick Villa, male   DOB: 06/27/1965, 51 y.o.   MRN: [id number]  [facility] Surgery Progress Note     Subjective: CC- perforated diverticulitis No change from yesterday. Continues to have lower abdominal pain. Having loose BMs. No emesis. WBC slightly down today 14.3, TMAX 99.3. Going for drain in IR today.  Objective: Vital signs in last 24 hours: Temp:  [98.3 F (36.8 C)-99.3 F (37.4 C)] 98.4 F (36.9 C) (09/25 0545) Pulse Rate:  [85-107] 85 (09/25 0545) Resp:  [15-16] 16 (09/25 0545) BP: (139-143)/(73-81) 139/73 (09/25 0545) SpO2:  [96 %-98 %] 96 % (09/25 0545) Last BM Date: 10/05/16  Intake/Output from previous day: 09/24 0701 - 09/25 0700 In: 2465 [P.O.:840; I.V.:1575; IV Piggyback:50] Out: -  Intake/Output this shift: No intake/output data recorded.  PE: Gen:  Alert, NAD, cooperative HEENT: EOM's intact, pupils equal and round Card:  RRR, no M/G/R heard Pulm:  CTAB, no W/R/R, effort normal Abd: Soft, distended, +BS, no HSM, no hernia, moderate TTP suprapubic region and LLQ with voluntary guarding and no rebound Ext:  No erythema, edema, or tenderness BUE/BLE  Psych: A&Ox3  Skin: no rashes noted, warm and dry  Lab Results:   Recent Labs  10/05/16 0515 10/06/16 0541  WBC 15.9* 14.3*  HGB 11.4* 10.9*  HCT 33.7* 32.1*  PLT 300 342   BMET  Recent Labs  10/05/16 0515 10/06/16 0541  NA 136 140  K 3.3* 3.6  CL 102 105  CO2 26 27  GLUCOSE 174* 109*  BUN 6 <5*  CREATININE 0.78 0.70  CALCIUM 8.5* 8.8*   PT/INR No results for input(s): LABPROT, INR in the last 72 hours. CMP     Component Value Date/Time   NA 140 10/06/2016 0541   K 3.6 10/06/2016 0541   CL 105 10/06/2016 0541   CO2 27 10/06/2016 0541   GLUCOSE 109 (H) 10/06/2016 0541   BUN <5 (L) 10/06/2016 0541   CREATININE 0.70 10/06/2016 0541   CALCIUM 8.8 (L) 10/06/2016 0541   PROT 8.8 (H) 10/02/2016 0044   ALBUMIN 3.9 10/02/2016 0044   AST 26 10/02/2016 0044   ALT 32  10/02/2016 0044   ALKPHOS 87 10/02/2016 0044   BILITOT 0.8 10/02/2016 0044   GFRNONAA >60 10/06/2016 0541   GFRAA >60 10/06/2016 0541   Lipase     Component Value Date/Time   LIPASE 22 10/02/2016 0044       Studies/Results: Ct Pelvis W Contrast  Result Date: 10/05/2016 CLINICAL DATA:  Diverticulitis follow-up EXAM: CT PELVIS WITH CONTRAST TECHNIQUE: Multidetector CT imaging of the pelvis was performed using the standard protocol following the bolus administration of intravenous contrast. CONTRAST:  ISOVUE-300 IOPAMIDOL (ISOVUE-300) INJECTION 61% COMPARISON:  10/02/2016 FINDINGS: Urinary Tract:  Urinary bladder appears normal. Bowel: Extensive distal colonic diverticulosis is again noted. Abnormal wall thickening and inflammation involving the sigmoid colon is again identified compatible with acute diverticulitis. There has been interval increase in volume of the diverticular abscess within the central pelvis. This measures 5.4 x 5.4 x 6.4 cm (volume = 98 cm^3). Previously 5.8 x 4.5 x 4.3 cm (volume = 59 cm^3). Vascular/Lymphatic: Aortic atherosclerosis. Prominent right external iliac node measures 7 mm. Reproductive:  No mass or other significant abnormality Other:  None. Musculoskeletal: No suspicious bone lesions identified. IMPRESSION: 1. Persistent inflammatory changes of perforated sigmoid diverticulitis. 2. Diverticular abscess has increased in volume from previous exam. Currently approximately 98 cc versus 59 cc previously. Electronically Signed   By:  Signa Kell M.D.   On: 10/05/2016 11:45    Anti-infectives: Anti-infectives    Start     Dose/Rate Route Frequency Ordered Stop   10/02/16 1200  piperacillin-tazobactam (ZOSYN) IVPB 3.375 g     3.375 g 12.5 mL/hr over 240 Minutes Intravenous Every 8 hours 10/02/16 0601     10/02/16 0430  vancomycin (VANCOCIN) IVPB 1000 mg/200 mL premix     1,000 mg 200 mL/hr over 60 Minutes Intravenous  Once 10/02/16 0426 10/02/16 0601    10/02/16 0430  piperacillin-tazobactam (ZOSYN) IVPB 3.375 g     3.375 g 100 mL/hr over 30 Minutes Intravenous  Once 10/02/16 0426 10/02/16 0529   10/02/16 0430  doxycycline (VIBRAMYCIN) 100 mg in dextrose 5 % 250 mL IVPB  Status:  Discontinued     100 mg 125 mL/hr over 120 Minutes Intravenous  Once 10/02/16 0426 10/02/16 0553   10/02/16 0215  cefTRIAXone (ROCEPHIN) injection 1 g     1 g Intramuscular  Once 10/02/16 0209 10/02/16 0227       Assessment/Plan UTI - Ucx 9/21 growing E coli Constipation  Hx of tobacco/ETOH use Hypokalemia - K 3.3, give oral potassium  Sigmoid diverticulitis with perforation and abscess - CT scan 9/21 showed Acute perforated diverticulitis with 5.4 cm abscess in the pelvis and with diffuse free air throughout the abdomen, patient declined IR drainage at that time - repeat scan 9/24 showed persistent inflammatory changes of perforated sigmoid Diverticulitis, increased size of diverticular abscess 5.4 x 5.4 x 6.4 cm - WBC slightly down today 14.3, TMAX 99.3  ID - zosyn 9/21>>day#5 FEN - IVF, NPO VTE - SCDs Foley - none  Plan: Going for drain placement in IR today. Keep NPO and continue IV antibiotics. Labs in AM.    LOS: 4 days    Franne Forts , [facility] Surgery 10/06/2016, 8:35 AM Pager: [phone] Consults: [phone] Mon-Fri 7:00 am-4:30 pm Sat-Sun 7:00 am-11:30 am

## 2016-10-06 NOTE — Progress Notes (Addendum)
PROGRESS NOTE  TYCE DELCID WUJ:[id number] DOB: 06-22-1965 DOA: 10/02/2016 PCP: Patient, No Pcp Per   LOS: 4 days   Brief Narrative / Interim history: 51 year old male admitted with severe abdominal pain, found to have acute diverticulitis with perforation and associated abscess.  General surgery and interventional radiology were consulted.  Assessment & Plan: Principal Problem:   Diverticulitis of colon with perforation Active Problems:   Acute lower UTI   Diverticulitis   Sepsis due to acute perforated diverticulitis -Meets criteria for sepsis with white count, tachycardia and a source -Continue Zosyn, appreciate surgery and IR input -After initial CT scan on 9/21, patient refused IR to place a drain and wanted to see what conservative management alone would do.  Repeat CT scan on 9/24 shows that the abscess has in fact grown in size and is now agreeable to percutaneous drainage.  Scheduled for today  ?  UTI -Patient without urinary symptoms, urine culture is growing however E. coli which is pansensitive.  On Zosyn for above   DVT prophylaxis: SCD Code Status: Full code Family Communication: No family at bedside this morning Disposition Plan: TBD  Consultants:   General surgery  IR  Procedures:   None   Antimicrobials:  Zosyn 9/21 >>   Subjective: - no chest pain, shortness of breath, no abdominal pain, nausea or vomiting.  Generally feeling well, had a BM this morning  Objective: Vitals:   10/05/16 0559 10/05/16 1404 10/05/16 2213 10/06/16 0545  BP: 113/63 (!) 142/81 (!) 143/76 139/73  Pulse: 84 94 (!) 107 85  Resp: Temp: 98.5 F (36.9 C) 99.3 F (37.4 C) 98.3 F (36.8 C) 98.4 F (36.9 C)  TempSrc: Oral Oral Oral Oral  SpO2: 98% 97% 98% 96%  Weight:      Height:        Intake/Output Summary (Last 24 hours) at 10/06/16 1236 Last data filed at 10/06/16 0600  Gross per 24 hour  Intake             2465 ml  Output                0 ml    Net             2465 ml   Filed Weights   10/02/16 0042  Weight: 99.8 kg (220 lb)    Examination:  Constitutional: NAD Respiratory: CTA Cardiovascular: RRR Abd: soft, tender to palpation mid lower abdomen  Data Reviewed: I have independently reviewed following labs and imaging studies  CBC:  Recent Labs Lab 10/02/16 0044 10/03/16 0553 10/04/16 1035 10/05/16 0515 10/06/16 0541  WBC 21.6* 13.2* 16.2* 15.9* 14.3*  HGB 14.7 11.7* 12.5* 11.4* 10.9*  HCT 41.3 34.8* 36.2* 33.7* 32.1*  MCV 87.1 88.3 87.7 88.2 88.2  PLT 300 277 294 300 342   Basic Metabolic Panel:  Recent Labs Lab 10/02/16 0044 10/03/16 0553 10/05/16 0515 10/06/16 0541  NA 134* 136 136 140  K 3.6 3.3* 3.3* 3.6  CL 99* 102 102 105  CO2 GLUCOSE 130* 98 174* 109*  BUN <5*  CREATININE 1.02 0.87 0.78 0.70  CALCIUM 9.5 8.7* 8.5* 8.8*   GFR: Estimated Creatinine Clearance: 133.7 mL/min (by C-G formula based on SCr of 0.7 mg/dL). Liver Function Tests:  Recent Labs Lab 10/02/16 0044  AST 26  ALT 32  ALKPHOS 87  BILITOT 0.8  PROT 8.8*  ALBUMIN 3.9    Recent  Labs Lab 10/02/16 0044  LIPASE 22   No results for input(s): AMMONIA in the last 168 hours. Coagulation Profile:  Recent Labs Lab 10/02/16 0938  INR 1.07   Cardiac Enzymes: No results for input(s): CKTOTAL, CKMB, CKMBINDEX, TROPONINI in the last 168 hours. BNP (last 3 results) No results for input(s): PROBNP in the last 8760 hours. HbA1C: No results for input(s): HGBA1C in the last 72 hours. CBG:  Recent Labs Lab 10/02/16 0803 10/03/16 0006  GLUCAP 141* 98   Lipid Profile: No results for input(s): CHOL, HDL, LDLCALC, TRIG, CHOLHDL, LDLDIRECT in the last 72 hours. Thyroid Function Tests: No results for input(s): TSH, T4TOTAL, FREET4, T3FREE, THYROIDAB in the last 72 hours. Anemia Panel: No results for input(s): VITAMINB12, FOLATE, FERRITIN, TIBC, IRON, RETICCTPCT in the last 72 hours. Urine  analysis:    Component Value Date/Time   COLORURINE AMBER (A) 10/02/2016 0135   APPEARANCEUR HAZY (A) 10/02/2016 0135   LABSPEC 1.022 10/02/2016 0135   PHURINE 5.0 10/02/2016 0135   GLUCOSEU NEGATIVE 10/02/2016 0135   HGBUR SMALL (A) 10/02/2016 0135   BILIRUBINUR NEGATIVE 10/02/2016 0135   KETONESUR 5 (A) 10/02/2016 0135   PROTEINUR >=300 (A) 10/02/2016 0135   NITRITE POSITIVE (A) 10/02/2016 0135   LEUKOCYTESUR MODERATE (A) 10/02/2016 0135   Sepsis Labs: Invalid input(s): PROCALCITONIN, LACTICIDVEN  Recent Results (from the past 240 hour(s))  Blood culture (routine x 2)     Status: None (Preliminary result)   Collection Time: 10/02/16  4:29 AM  Result Value Ref Range Status   Specimen Description BLOOD LEFT ARM  Final   Special Requests   Final    BOTTLES DRAWN AEROBIC AND ANAEROBIC Blood Culture adequate volume   Culture   Final    NO GROWTH 3 DAYS Performed at [facility], [address] [place], [place] [address]    Report Status PENDING  Incomplete  Blood culture (routine x 2)     Status: None (Preliminary result)   Collection Time: 10/02/16  4:53 AM  Result Value Ref Range Status   Specimen Description BLOOD RIGHT HAND  Final   Special Requests IN PEDIATRIC BOTTLE Blood Culture adequate volume  Final   Culture   Final    NO GROWTH 3 DAYS Performed at [facility], [address], [place], [place] [address]    Report Status PENDING  Incomplete  Culture, Urine     Status: Abnormal   Collection Time: 10/02/16  6:29 PM  Result Value Ref Range Status   Specimen Description URINE, RANDOM  Final   Special Requests   Final    urine culture add on Performed at [facility], [address] [place], [place] [address]    Culture >=100,000 COLONIES/mL ESCHERICHIA COLI (A)  Final   Report Status 10/05/2016 FINAL  Final   Organism ID, Bacteria ESCHERICHIA COLI (A)  Final      Susceptibility   Escherichia coli - MIC*    AMPICILLIN <=2 SENSITIVE Sensitive      CEFAZOLIN <=4 SENSITIVE Sensitive     CEFTRIAXONE <=1 SENSITIVE Sensitive     CIPROFLOXACIN <=0.25 SENSITIVE Sensitive     GENTAMICIN <=1 SENSITIVE Sensitive     IMIPENEM <=0.25 SENSITIVE Sensitive     NITROFURANTOIN <=16 SENSITIVE Sensitive     TRIMETH/SULFA <=20 SENSITIVE Sensitive     AMPICILLIN/SULBACTAM <=2 SENSITIVE Sensitive     PIP/TAZO <=4 SENSITIVE Sensitive     Extended ESBL NEGATIVE Sensitive     * >=  100,000 COLONIES/mL ESCHERICHIA COLI      Radiology Studies: Ct Pelvis W Contrast  Result Date: 10/05/2016 CLINICAL DATA:  Diverticulitis follow-up EXAM: CT PELVIS WITH CONTRAST TECHNIQUE: Multidetector CT imaging of the pelvis was performed using the standard protocol following the bolus administration of intravenous contrast. CONTRAST:  ISOVUE-300 IOPAMIDOL (ISOVUE-300) INJECTION 61% COMPARISON:  10/02/2016 FINDINGS: Urinary Tract:  Urinary bladder appears normal. Bowel: Extensive distal colonic diverticulosis is again noted. Abnormal wall thickening and inflammation involving the sigmoid colon is again identified compatible with acute diverticulitis. There has been interval increase in volume of the diverticular abscess within the central pelvis. This measures 5.4 x 5.4 x 6.4 cm (volume = 98 cm^3). Previously 5.8 x 4.5 x 4.3 cm (volume = 59 cm^3). Vascular/Lymphatic: Aortic atherosclerosis. Prominent right external iliac node measures 7 mm. Reproductive:  No mass or other significant abnormality Other:  None. Musculoskeletal: No suspicious bone lesions identified. IMPRESSION: 1. Persistent inflammatory changes of perforated sigmoid diverticulitis. 2. Diverticular abscess has increased in volume from previous exam. Currently approximately 98 cc versus 59 cc previously. Electronically Signed   By: Signa Kell M.D.   On: 10/05/2016 11:45     Scheduled Meds: . fentaNYL      . midazolam       Continuous Infusions: . dextrose 5 % and 0.9% NaCl 75 mL/hr at 10/06/16 0551   . piperacillin-tazobactam (ZOSYN)  IV Stopped (10/06/16 1610)    Pamella Pert, MD, PhD [facility] Pager [phone]  If 7PM-7AM, please contact night-coverage [email or web address] Password [email or web address] 10/06/2016, 12:36 PM

## 2016-10-06 NOTE — Procedures (Signed)
Pre procedural Dx: Diverticular abscess Post procedural Dx: Same  Technically successful CT guided placed of a 10 Fr drainage catheter placement into the pelvis via right TG approach yielding 80 cc of purulent fluid.    All aspirated samples sent to the laboratory for analysis.    EBL: Trace  Complications: None immediate  Katherina Right, MD Pager #: [phone]

## 2016-10-07 DIAGNOSIS — K5720 Diverticulitis of large intestine with perforation and abscess without bleeding: Secondary | ICD-10-CM

## 2016-10-07 LAB — CULTURE, BLOOD (ROUTINE X 2)
CULTURE: NO GROWTH
Culture: NO GROWTH
SPECIAL REQUESTS: ADEQUATE
SPECIAL REQUESTS: ADEQUATE

## 2016-10-07 LAB — BASIC METABOLIC PANEL
ANION GAP: 8 (ref 5–15)
BUN: <5 mg/dL — ABNORMAL LOW (ref 6–20)
CALCIUM: 8.6 mg/dL — AB (ref 8.9–10.3)
CO2: 27 mmol/L (ref 22–32)
Chloride: 103 mmol/L (ref 101–111)
Creatinine, Ser: 0.75 mg/dL (ref 0.61–1.24)
Glucose, Bld: 139 mg/dL — ABNORMAL HIGH (ref 65–99)
POTASSIUM: 3.3 mmol/L — AB (ref 3.5–5.1)
SODIUM: 138 mmol/L (ref 135–145)

## 2016-10-07 LAB — CBC
HCT: 31.5 % — ABNORMAL LOW (ref 39.0–52.0)
Hemoglobin: 10.7 g/dL — ABNORMAL LOW (ref 13.0–17.0)
MCH: 30 pg (ref 26.0–34.0)
MCHC: 34 g/dL (ref 30.0–36.0)
MCV: 88.2 fL (ref 78.0–100.0)
PLATELETS: 356 10*3/uL (ref 150–400)
RBC: 3.57 MIL/uL — AB (ref 4.22–5.81)
RDW: 12.4 % (ref 11.5–15.5)
WBC: 13.1 10*3/uL — AB (ref 4.0–10.5)

## 2016-10-07 MED ORDER — SODIUM CHLORIDE 0.9% FLUSH
5.0000 mL | Freq: Three times a day (TID) | INTRAVENOUS | Status: DC
  Administered 2016-10-07 – 2016-10-09 (×7): 5 mL via INTRAVENOUS

## 2016-10-07 MED ORDER — POTASSIUM CHLORIDE CRYS ER 20 MEQ PO TBCR
20.0000 meq | EXTENDED_RELEASE_TABLET | Freq: Three times a day (TID) | ORAL | Status: AC
  Administered 2016-10-07 (×3): 20 meq via ORAL
  Filled 2016-10-07 (×3): qty 1

## 2016-10-07 NOTE — Progress Notes (Signed)
Patient ID: Derrick Villa, male   DOB: 1965-08-10, 51 y.o.   MRN: [id number]  [facility] Surgery Progress Note     Subjective: CC- perforated diverticulitis Transgluteal drain placed in IR yesterday with 80 cc of purulent fluid out. Patient states that his abdominal pain is improving. Denies n/v. Tolerating few full liquids; states that when he eats is pain does not increase.   Objective: Vital signs in last 24 hours: Temp:  [98.4 F (36.9 C)-99.7 F (37.6 C)] 99.7 F (37.6 C) (09/25 2146) Pulse Rate:  [84-99] 99 (09/25 2146) Resp:  [15-23] 20 (09/25 2146) BP: (122-163)/(79-103) 158/79 (09/25 2146) SpO2:  [94 %-100 %] 97 % (09/25 2146) Last BM Date: 10/06/16  Intake/Output from previous day: 09/25 0701 - 09/26 0700 In: 1755 [P.O.:480; I.V.:1125; IV Piggyback:150] Out: 10 [Drains:10] Intake/Output this shift: No intake/output data recorded.  PE: Gen: Alert, NAD, cooperative HEENT: EOM's intact, pupils equal and round Card: RRR, no M/G/R heard Pulm: CTAB, no W/R/R, effort normal Abd: Soft, distended, +BS, no HSM, no hernia, minimal LLQ TTP with no rebound or guarding. Transgluteal drain in place with minimal serosanguinous fluid Ext: No erythema, edema, or tenderness BUE/BLE  Psych: A&Ox3  Skin: no rashes noted, warm and dry  Lab Results:   Recent Labs  10/06/16 0541 10/07/16 0524  WBC 14.3* 13.1*  HGB 10.9* 10.7*  HCT 32.1* 31.5*  PLT 342 356   BMET  Recent Labs  10/06/16 0541 10/07/16 0524  NA 140 138  K 3.6 3.3*  CL 105 103  CO2 27 27  GLUCOSE 109* 139*  BUN <5* <5*  CREATININE 0.70 0.75  CALCIUM 8.8* 8.6*   PT/INR No results for input(s): LABPROT, INR in the last 72 hours. CMP     Component Value Date/Time   NA 138 10/07/2016 0524   K 3.3 (L) 10/07/2016 0524   CL 103 10/07/2016 0524   CO2 27 10/07/2016 0524   GLUCOSE 139 (H) 10/07/2016 0524   BUN <5 (L) 10/07/2016 0524   CREATININE 0.75 10/07/2016 0524   CALCIUM 8.6 (L) 10/07/2016  0524   PROT 8.8 (H) 10/02/2016 0044   ALBUMIN 3.9 10/02/2016 0044   AST 26 10/02/2016 0044   ALT 32 10/02/2016 0044   ALKPHOS 87 10/02/2016 0044   BILITOT 0.8 10/02/2016 0044   GFRNONAA >60 10/07/2016 0524   GFRAA >60 10/07/2016 0524   Lipase     Component Value Date/Time   LIPASE 22 10/02/2016 0044       Studies/Results: Ct Pelvis W Contrast  Result Date: 10/05/2016 CLINICAL DATA:  Diverticulitis follow-up EXAM: CT PELVIS WITH CONTRAST TECHNIQUE: Multidetector CT imaging of the pelvis was performed using the standard protocol following the bolus administration of intravenous contrast. CONTRAST:  ISOVUE-300 IOPAMIDOL (ISOVUE-300) INJECTION 61% COMPARISON:  10/02/2016 FINDINGS: Urinary Tract:  Urinary bladder appears normal. Bowel: Extensive distal colonic diverticulosis is again noted. Abnormal wall thickening and inflammation involving the sigmoid colon is again identified compatible with acute diverticulitis. There has been interval increase in volume of the diverticular abscess within the central pelvis. This measures 5.4 x 5.4 x 6.4 cm (volume = 98 cm^3). Previously 5.8 x 4.5 x 4.3 cm (volume = 59 cm^3). Vascular/Lymphatic: Aortic atherosclerosis. Prominent right external iliac node measures 7 mm. Reproductive:  No mass or other significant abnormality Other:  None. Musculoskeletal: No suspicious bone lesions identified. IMPRESSION: 1. Persistent inflammatory changes of perforated sigmoid diverticulitis. 2. Diverticular abscess has increased in volume from previous exam. Currently approximately  98 cc versus 59 cc previously. Electronically Signed   By: Signa Kell M.D.   On: 10/05/2016 11:45   Ct Image Guided Drainage By Percutaneous Catheter  Result Date: 10/06/2016 INDICATION: Diverticular abscess. Please perform CT-guided drainage catheter placement for infection source control purposes. EXAM: CT IMAGE GUIDED DRAINAGE BY PERCUTANEOUS CATHETER COMPARISON:  CT abdomen pelvis -  10/02/2016; pelvic CT - 10/05/2016 MEDICATIONS: The patient is currently admitted to the hospital and receiving intravenous antibiotics. The antibiotics were administered within an appropriate time frame prior to the initiation of the procedure. ANESTHESIA/SEDATION: Moderate (conscious) sedation was employed during this procedure. A total of Versed 4 mg and Fentanyl 200 mcg was administered intravenously. Moderate Sedation Time: 16 minutes. The patient's level of consciousness and vital signs were monitored continuously by radiology nursing throughout the procedure under my direct supervision. CONTRAST:  None COMPLICATIONS: None immediate. PROCEDURE: Informed written consent was obtained from the patient after a discussion of the risks, benefits and alternatives to treatment. The patient was placed prone on the CT gantry and a pre procedural CT was performed re-demonstrating the known abscess/fluid collection within the midline of the lower pelvis. The procedure was planned. A timeout was performed prior to the initiation of the procedure. The right buttocks was prepped and draped in the usual sterile fashion. The overlying soft tissues were anesthetized with 1% lidocaine with epinephrine. Appropriate trajectory was planned with the use of a 22 gauge spinal needle. An 18 gauge trocar needle was advanced into the abscess/fluid collection and a short Amplatz super stiff wire was coiled within the collection. Appropriate positioning was confirmed with a limited CT scan. The tract was serially dilated allowing placement of a 10 [place] all-purpose drainage catheter. Appropriate positioning was confirmed with a limited postprocedural CT scan. 80 ml of purulent fluid was aspirated. The tube was connected to a drainage bag and sutured in place. A dressing was placed. The patient tolerated the procedure well without immediate post procedural complication. IMPRESSION: Successful CT guided placement of a 10 French all purpose  drain catheter into the midline of lower pelvis via right trans gluteal approach with aspiration of 80 mL of purulent fluid. Samples were sent to the laboratory as requested by the ordering clinical team. Electronically Signed   By: Simonne Come M.D.   On: 10/06/2016 14:41    Anti-infectives: Anti-infectives    Start     Dose/Rate Route Frequency Ordered Stop   10/02/16 1200  piperacillin-tazobactam (ZOSYN) IVPB 3.375 g     3.375 g 12.5 mL/hr over 240 Minutes Intravenous Every 8 hours 10/02/16 0601     10/02/16 0430  vancomycin (VANCOCIN) IVPB 1000 mg/200 mL premix     1,000 mg 200 mL/hr over 60 Minutes Intravenous  Once 10/02/16 0426 10/02/16 0601   10/02/16 0430  piperacillin-tazobactam (ZOSYN) IVPB 3.375 g     3.375 g 100 mL/hr over 30 Minutes Intravenous  Once 10/02/16 0426 10/02/16 0529   10/02/16 0430  doxycycline (VIBRAMYCIN) 100 mg in dextrose 5 % 250 mL IVPB  Status:  Discontinued     100 mg 125 mL/hr over 120 Minutes Intravenous  Once 10/02/16 0426 10/02/16 0553   10/02/16 0215  cefTRIAXone (ROCEPHIN) injection 1 g     1 g Intramuscular  Once 10/02/16 0209 10/02/16 0227       Assessment/Plan UTI - Ucx 9/21 growing E coli Constipation  Hx of tobacco/ETOH use Hypokalemia - K 3.3, give oral potassium  Sigmoid diverticulitis with perforation and abscess -  CT scan 9/21 showed Acute perforated diverticulitis with 5.4 cm abscess in the pelvis and with diffuse free air throughout the abdomen, patient declined IR drainage at that time - repeat scan 9/24 showed persistent inflammatory changes of perforated sigmoid Diverticulitis, increased size of diverticular abscess 5.4 x 5.4 x 6.4 cm - s/p IR drain 9/26. Gram stain showing moderate gram negative rods, culture pending - WBC slightly down today 13.1, TMAX 99.7  ID - zosyn 9/21>>day#6 FEN - IVF, full liquids VTE - SCDs Foley - none  Plan: Pain improving and WBC trending down. Continue drain and IV antibiotics. Follow  culture.   LOS: 5 days    Franne Forts , [facility] Surgery 10/07/2016, 8:16 AM Pager: [phone] Consults: [phone] Mon-Fri 7:00 am-4:30 pm Sat-Sun 7:00 am-11:30 am

## 2016-10-07 NOTE — Progress Notes (Signed)
PROGRESS NOTE  Derrick Villa:[id number] DOB: 05/16/1965 DOA: 10/02/2016 PCP: Patient, No Pcp Per   LOS: 5 days   Brief Narrative / Interim history: 51 year old male admitted with severe abdominal pain, found to have acute diverticulitis with perforation and associated abscess.  General surgery and interventional radiology were consulted.  Assessment & Plan: Principal Problem:   Diverticulitis of colon with perforation Active Problems:   Acute lower UTI   Diverticulitis   Sepsis due to acute perforated diverticulitis -Meets criteria for sepsis with white count, tachycardia and a source -Continue Zosyn, appreciate surgery and IR input -After initial CT scan on 9/21, patient refused IR to place a drain and wanted to see what conservative management alone would do.  Repeat CT scan on 9/24 shows that the abscess has in fact grown in size and is now agreeable to percutaneous drainage.   -patient underwent CT guide abscess drain by IR 9-26. Continue with IV antibiotics.   ?  UTI -Patient without urinary symptoms, urine culture is growing however E. coli which is pansensitive.  On Zosyn for above  Hypokalemia; oral supplement.    DVT prophylaxis: SCD Code Status: Full code Family Communication: No family at bedside this morning Disposition Plan: TBD  Consultants:   General surgery  IR  Procedures:   None   Antimicrobials:  Zosyn 9/21 >>   Subjective: He is afraid of having BM and abdominal pain.  He report pain is better   Objective: Vitals:   10/06/16 1326 10/06/16 1343 10/06/16 2146 10/07/16 1505  BP:  (!) 163/94 (!) 158/79 (!) 159/83  Pulse:  92 99 85  Resp:  Temp:  98.4 F (36.9 C) 99.7 F (37.6 C) 98.7 F (37.1 C)  TempSrc:  Oral Oral Oral  SpO2: 95% 94% 97% 99%  Weight:      Height:        Intake/Output Summary (Last 24 hours) at 10/07/16 1525 Last data filed at 10/07/16 1349  Gross per 24 hour  Intake          2341.25 ml  Output                10 ml  Net          2331.25 ml   Filed Weights   10/02/16 0042  Weight: 99.8 kg (220 lb)    Examination:  Constitutional: NAD Respiratory: CTA Cardiovascular: S 1, S 2 RRR Abd: soft, obese, mild tenderness, transgluteal drain in place with bloody fluid.   Data Reviewed: I have independently reviewed following labs and imaging studies  CBC:  Recent Labs Lab 10/03/16 0553 10/04/16 1035 10/05/16 0515 10/06/16 0541 10/07/16 0524  WBC 13.2* 16.2* 15.9* 14.3* 13.1*  HGB 11.7* 12.5* 11.4* 10.9* 10.7*  HCT 34.8* 36.2* 33.7* 32.1* 31.5*  MCV 88.3 87.7 88.2 88.2 88.2  PLT 277 294 300 342 356   Basic Metabolic Panel:  Recent Labs Lab 10/02/16 0044 10/03/16 0553 10/05/16 0515 10/06/16 0541 10/07/16 0524  NA 134* 136 136 140 138  K 3.6 3.3* 3.3* 3.6 3.3*  CL 99* 102 102 105 103  CO2 GLUCOSE 130* 98 174* 109* 139*  BUN <5* <5*  CREATININE 1.02 0.87 0.78 0.70 0.75  CALCIUM 9.5 8.7* 8.5* 8.8* 8.6*   GFR: Estimated Creatinine Clearance: 133.7 mL/min (by C-G formula based on SCr of 0.75 mg/dL). Liver Function Tests:  Recent Labs Lab 10/02/16 0044  AST 26  ALT 32  ALKPHOS 87  BILITOT 0.8  PROT 8.8*  ALBUMIN 3.9    Recent Labs Lab 10/02/16 0044  LIPASE 22   No results for input(s): AMMONIA in the last 168 hours. Coagulation Profile:  Recent Labs Lab 10/02/16 0938  INR 1.07   Cardiac Enzymes: No results for input(s): CKTOTAL, CKMB, CKMBINDEX, TROPONINI in the last 168 hours. BNP (last 3 results) No results for input(s): PROBNP in the last 8760 hours. HbA1C: No results for input(s): HGBA1C in the last 72 hours. CBG:  Recent Labs Lab 10/02/16 0803 10/03/16 0006  GLUCAP 141* 98   Lipid Profile: No results for input(s): CHOL, HDL, LDLCALC, TRIG, CHOLHDL, LDLDIRECT in the last 72 hours. Thyroid Function Tests: No results for input(s): TSH, T4TOTAL, FREET4, T3FREE, THYROIDAB in the last 72 hours. Anemia Panel: No  results for input(s): VITAMINB12, FOLATE, FERRITIN, TIBC, IRON, RETICCTPCT in the last 72 hours. Urine analysis:    Component Value Date/Time   COLORURINE AMBER (A) 10/02/2016 0135   APPEARANCEUR HAZY (A) 10/02/2016 0135   LABSPEC 1.022 10/02/2016 0135   PHURINE 5.0 10/02/2016 0135   GLUCOSEU NEGATIVE 10/02/2016 0135   HGBUR SMALL (A) 10/02/2016 0135   BILIRUBINUR NEGATIVE 10/02/2016 0135   KETONESUR 5 (A) 10/02/2016 0135   PROTEINUR >=300 (A) 10/02/2016 0135   NITRITE POSITIVE (A) 10/02/2016 0135   LEUKOCYTESUR MODERATE (A) 10/02/2016 0135   Sepsis Labs: Invalid input(s): PROCALCITONIN, LACTICIDVEN  Recent Results (from the past 240 hour(s))  Blood culture (routine x 2)     Status: None (Preliminary result)   Collection Time: 10/02/16  4:29 AM  Result Value Ref Range Status   Specimen Description BLOOD LEFT ARM  Final   Special Requests   Final    BOTTLES DRAWN AEROBIC AND ANAEROBIC Blood Culture adequate volume   Culture   Final    NO GROWTH 4 DAYS Performed at [facility], [address], [place], [place] [address]    Report Status PENDING  Incomplete  Blood culture (routine x 2)     Status: None (Preliminary result)   Collection Time: 10/02/16  4:53 AM  Result Value Ref Range Status   Specimen Description BLOOD RIGHT HAND  Final   Special Requests IN PEDIATRIC BOTTLE Blood Culture adequate volume  Final   Culture   Final    NO GROWTH 4 DAYS Performed at [facility], [address], [place], [place] [address]    Report Status PENDING  Incomplete  Culture, Urine     Status: Abnormal   Collection Time: 10/02/16  6:29 PM  Result Value Ref Range Status   Specimen Description URINE, RANDOM  Final   Special Requests   Final    urine culture add on Performed at [facility], [address], [place], [place] [address]    Culture >=100,000 COLONIES/mL ESCHERICHIA COLI (A)  Final   Report Status 10/05/2016 FINAL  Final   Organism ID, Bacteria  ESCHERICHIA COLI (A)  Final      Susceptibility   Escherichia coli - MIC*    AMPICILLIN <=2 SENSITIVE Sensitive     CEFAZOLIN <=4 SENSITIVE Sensitive     CEFTRIAXONE <=1 SENSITIVE Sensitive     CIPROFLOXACIN <=0.25 SENSITIVE Sensitive     GENTAMICIN <=1 SENSITIVE Sensitive     IMIPENEM <=0.25 SENSITIVE Sensitive     NITROFURANTOIN <=16 SENSITIVE Sensitive     TRIMETH/SULFA <=20 SENSITIVE Sensitive     AMPICILLIN/SULBACTAM <=2 SENSITIVE Sensitive  PIP/TAZO <=4 SENSITIVE Sensitive     Extended ESBL NEGATIVE Sensitive     * >=100,000 COLONIES/mL ESCHERICHIA COLI  Aerobic/Anaerobic Culture (surgical/deep wound)     Status: None (Preliminary result)   Collection Time: 10/06/16  1:36 PM  Result Value Ref Range Status   Specimen Description ABSCESS DRAIN  Final   Special Requests Normal  Final   Gram Stain   Final    ABUNDANT WBC PRESENT, PREDOMINANTLY PMN MODERATE GRAM NEGATIVE RODS    Culture   Final    MODERATE GRAM NEGATIVE RODS IDENTIFICATION AND SUSCEPTIBILITIES TO FOLLOW Performed at [facility], [address], [place], [place] [address]    Report Status PENDING  Incomplete      Radiology Studies: Ct Image Guided Drainage By Percutaneous Catheter  Result Date: 10/06/2016 INDICATION: Diverticular abscess. Please perform CT-guided drainage catheter placement for infection source control purposes. EXAM: CT IMAGE GUIDED DRAINAGE BY PERCUTANEOUS CATHETER COMPARISON:  CT abdomen pelvis - 10/02/2016; pelvic CT - 10/05/2016 MEDICATIONS: The patient is currently admitted to the hospital and receiving intravenous antibiotics. The antibiotics were administered within an appropriate time frame prior to the initiation of the procedure. ANESTHESIA/SEDATION: Moderate (conscious) sedation was employed during this procedure. A total of Versed 4 mg and Fentanyl 200 mcg was administered intravenously. Moderate Sedation Time: 16 minutes. The patient's level of consciousness and vital signs  were monitored continuously by radiology nursing throughout the procedure under my direct supervision. CONTRAST:  None COMPLICATIONS: None immediate. PROCEDURE: Informed written consent was obtained from the patient after a discussion of the risks, benefits and alternatives to treatment. The patient was placed prone on the CT gantry and a pre procedural CT was performed re-demonstrating the known abscess/fluid collection within the midline of the lower pelvis. The procedure was planned. A timeout was performed prior to the initiation of the procedure. The right buttocks was prepped and draped in the usual sterile fashion. The overlying soft tissues were anesthetized with 1% lidocaine with epinephrine. Appropriate trajectory was planned with the use of a 22 gauge spinal needle. An 18 gauge trocar needle was advanced into the abscess/fluid collection and a short Amplatz super stiff wire was coiled within the collection. Appropriate positioning was confirmed with a limited CT scan. The tract was serially dilated allowing placement of a 10 [place] all-purpose drainage catheter. Appropriate positioning was confirmed with a limited postprocedural CT scan. 80 ml of purulent fluid was aspirated. The tube was connected to a drainage bag and sutured in place. A dressing was placed. The patient tolerated the procedure well without immediate post procedural complication. IMPRESSION: Successful CT guided placement of a 10 French all purpose drain catheter into the midline of lower pelvis via right trans gluteal approach with aspiration of 80 mL of purulent fluid. Samples were sent to the laboratory as requested by the ordering clinical team. Electronically Signed   By: Simonne Come M.D.   On: 10/06/2016 14:41     Scheduled Meds: . potassium chloride  20 mEq Oral TID  . sodium chloride flush  5 mL Intravenous Q8H   Continuous Infusions: . dextrose 5 % and 0.9% NaCl 75 mL/hr at 10/07/16 1349  . piperacillin-tazobactam  (ZOSYN)  IV 3.375 g (10/07/16 1318)    Valora Norell.  [facility] Pager [phone]  If 7PM-7AM, please contact night-coverage [email or web address] Password [facility] 10/07/2016, 3:25 PM

## 2016-10-07 NOTE — Progress Notes (Signed)
Referring Physician(s): Connor,C  Supervising Physician: Malachy Moan  Patient Status:  [facility] - In-pt  Chief Complaint:  Pelvic abscess  Subjective: Patient feeling better today. Denies nausea, vomiting. Has some soreness at right buttocks region as expected post drainage. Tolerating liquid diet.   Allergies: Patient has no known allergies.  Medications: Prior to Admission medications   Medication Sig Start Date End Date Taking? Authorizing Provider  ibuprofen (MOTRIN IB) 200 MG tablet Take 3 tablets (600 mg total) by mouth every 6 (six) hours as needed for pain. Patient taking differently: Take 400 mg by mouth every 6 (six) hours as needed for pain. Pain 08/19/12  Yes Jorje Guild N, PA-C  doxycycline (VIBRAMYCIN) 100 MG capsule Take 1 capsule (100 mg total) by mouth 2 (two) times daily. Patient not taking: Reported on 10/02/2016 05/29/15   Benjiman Core, MD  Ipratropium-Albuterol (COMBIVENT) 20-100 MCG/ACT AERS respimat Inhale 2 puffs into the lungs every 6 (six) hours as needed for wheezing. Patient not taking: Reported on 05/29/2015 08/19/12   Nonie Hoyer, PA-C  methocarbamol (ROBAXIN) 500 MG tablet Take 1.5 tablets (750 mg total) by mouth every 6 (six) hours as needed. Patient not taking: Reported on 05/29/2015 08/19/12   Nonie Hoyer, PA-C  oxyCODONE (OXY IR/ROXICODONE) 5 MG immediate release tablet Take 1-3 tablets (5-15 mg total) by mouth every 4 (four) hours as needed (Take  for mild pain,  for moderate pain,  for severe pain). Patient not taking: Reported on 05/29/2015 08/19/12   Nonie Hoyer, PA-C  oxyCODONE-acetaminophen (PERCOCET/ROXICET) 5-325 MG tablet Take 1-2 tablets by mouth every 6 (six) hours as needed for severe pain. Patient not taking: Reported on 10/02/2016 05/29/15   Benjiman Core, MD     Vital Signs: BP (!) 159/83 (BP Location: Left Arm)   Pulse 85   Temp 98.7 F (37.1 C) (Oral)   Resp 18   Ht 6' (1.829 m)   Wt 220 lb (99.8 kg)    SpO2 99%   BMI 29.84 kg/m   Physical Exam awake, alert. Right transgluteal drain intact, dressing dry, site mildly tender to palpation, output  15-20 mL of purulent blood-tinged fluid; cultures pending  Imaging: Ct Pelvis W Contrast  Result Date: 10/05/2016 CLINICAL DATA:  Diverticulitis follow-up EXAM: CT PELVIS WITH CONTRAST TECHNIQUE: Multidetector CT imaging of the pelvis was performed using the standard protocol following the bolus administration of intravenous contrast. CONTRAST:  ISOVUE-300 IOPAMIDOL (ISOVUE-300) INJECTION 61% COMPARISON:  10/02/2016 FINDINGS: Urinary Tract:  Urinary bladder appears normal. Bowel: Extensive distal colonic diverticulosis is again noted. Abnormal wall thickening and inflammation involving the sigmoid colon is again identified compatible with acute diverticulitis. There has been interval increase in volume of the diverticular abscess within the central pelvis. This measures 5.4 x 5.4 x 6.4 cm (volume = 98 cm^3). Previously 5.8 x 4.5 x 4.3 cm (volume = 59 cm^3). Vascular/Lymphatic: Aortic atherosclerosis. Prominent right external iliac node measures 7 mm. Reproductive:  No mass or other significant abnormality Other:  None. Musculoskeletal: No suspicious bone lesions identified. IMPRESSION: 1. Persistent inflammatory changes of perforated sigmoid diverticulitis. 2. Diverticular abscess has increased in volume from previous exam. Currently approximately 98 cc versus 59 cc previously. Electronically Signed   By: Signa Kell M.D.   On: 10/05/2016 11:45   Ct Image Guided Drainage By Percutaneous Catheter  Result Date: 10/06/2016 INDICATION: Diverticular abscess. Please perform CT-guided drainage catheter placement for infection source control purposes. EXAM: CT IMAGE GUIDED DRAINAGE BY PERCUTANEOUS CATHETER  COMPARISON:  CT abdomen pelvis - 10/02/2016; pelvic CT - 10/05/2016 MEDICATIONS: The patient is currently admitted to the hospital and receiving  intravenous antibiotics. The antibiotics were administered within an appropriate time frame prior to the initiation of the procedure. ANESTHESIA/SEDATION: Moderate (conscious) sedation was employed during this procedure. A total of Versed 4 mg and Fentanyl 200 mcg was administered intravenously. Moderate Sedation Time: 16 minutes. The patient's level of consciousness and vital signs were monitored continuously by radiology nursing throughout the procedure under my direct supervision. CONTRAST:  None COMPLICATIONS: None immediate. PROCEDURE: Informed written consent was obtained from the patient after a discussion of the risks, benefits and alternatives to treatment. The patient was placed prone on the CT gantry and a pre procedural CT was performed re-demonstrating the known abscess/fluid collection within the midline of the lower pelvis. The procedure was planned. A timeout was performed prior to the initiation of the procedure. The right buttocks was prepped and draped in the usual sterile fashion. The overlying soft tissues were anesthetized with 1% lidocaine with epinephrine. Appropriate trajectory was planned with the use of a 22 gauge spinal needle. An 18 gauge trocar needle was advanced into the abscess/fluid collection and a short Amplatz super stiff wire was coiled within the collection. Appropriate positioning was confirmed with a limited CT scan. The tract was serially dilated allowing placement of a 10 [place] all-purpose drainage catheter. Appropriate positioning was confirmed with a limited postprocedural CT scan. 80 ml of purulent fluid was aspirated. The tube was connected to a drainage bag and sutured in place. A dressing was placed. The patient tolerated the procedure well without immediate post procedural complication. IMPRESSION: Successful CT guided placement of a 10 French all purpose drain catheter into the midline of lower pelvis via right trans gluteal approach with aspiration of 80 mL of  purulent fluid. Samples were sent to the laboratory as requested by the ordering clinical team. Electronically Signed   By: Simonne Come M.D.   On: 10/06/2016 14:41    Labs:  CBC:  Recent Labs  10/04/16 1035 10/05/16 0515 10/06/16 0541 10/07/16 0524  WBC 16.2* 15.9* 14.3* 13.1*  HGB 12.5* 11.4* 10.9* 10.7*  HCT 36.2* 33.7* 32.1* 31.5*  PLT 294 300 342 356    COAGS:  Recent Labs  10/02/16 0938  INR 1.07    BMP:  Recent Labs  10/03/16 0553 10/05/16 0515 10/06/16 0541 10/07/16 0524  NA 136 136 140 138  K 3.3* 3.3* 3.6 3.3*  CL 102 102 105 103  CO2 GLUCOSE 98 174* 109* 139*  BUN 11 6 <5* <5*  CALCIUM 8.7* 8.5* 8.8* 8.6*  CREATININE 0.87 0.78 0.70 0.75  GFRNONAA >60 >60 >60 >60  GFRAA >60 >60 >60 >60    LIVER FUNCTION TESTS:  Recent Labs  10/02/16 0044  BILITOT 0.8  AST 26  ALT 32  ALKPHOS 87  PROT 8.8*  ALBUMIN 3.9    Assessment and Plan: Patient with history of diverticular abscess, status post CT-guided drainage on 9/25; temp 99.7, WBC 13.1 (14.3), hemoglobin stable, creatinine normal, K 3.3, drain fluid cultures pending. Continue with drain irrigation,monitor output closely; check final fluid culture/sensitivity. Obtain follow-up CT within 1 week of drain placement; will likely need drain injection before considering removal.   Electronically Signed: D. Jeananne Rama, PA-C 10/07/2016, 3:56 PM   I spent a total of 15 minutes at the the patient's bedside AND on the patient's hospital floor or unit, greater than  50% of which was counseling/coordinating care for pelvic abscess drain    Patient ID: DARREN CALDRON, male   DOB: [birth year], 51 y.o.   MRN: [id number]

## 2016-10-08 LAB — BASIC METABOLIC PANEL
Anion gap: 9 (ref 5–15)
CALCIUM: 9.2 mg/dL (ref 8.9–10.3)
CO2: 25 mmol/L (ref 22–32)
CREATININE: 0.73 mg/dL (ref 0.61–1.24)
Chloride: 104 mmol/L (ref 101–111)
GFR calc Af Amer: >60 mL/min (ref >60)
GLUCOSE: 112 mg/dL — AB (ref 65–99)
Potassium: 4 mmol/L (ref 3.5–5.1)
SODIUM: 138 mmol/L (ref 135–145)

## 2016-10-08 LAB — CBC
HCT: 34.9 % — ABNORMAL LOW (ref 39.0–52.0)
Hemoglobin: 11.8 g/dL — ABNORMAL LOW (ref 13.0–17.0)
MCH: 29.3 pg (ref 26.0–34.0)
MCHC: 33.8 g/dL (ref 30.0–36.0)
MCV: 86.6 fL (ref 78.0–100.0)
PLATELETS: 385 10*3/uL (ref 150–400)
RBC: 4.03 MIL/uL — ABNORMAL LOW (ref 4.22–5.81)
RDW: 12.2 % (ref 11.5–15.5)
WBC: 9.5 10*3/uL (ref 4.0–10.5)

## 2016-10-08 MED ORDER — HYDROMORPHONE HCL-NACL 0.5-0.9 MG/ML-% IV SOSY: 1.0000 mg | PREFILLED_SYRINGE | INTRAVENOUS | Status: DC | PRN

## 2016-10-08 MED ORDER — SULFAMETHOXAZOLE-TRIMETHOPRIM 800-160 MG PO TABS
1.0000 | ORAL_TABLET | Freq: Two times a day (BID) | ORAL | Status: DC
  Administered 2016-10-08 (×2): 1 via ORAL
  Filled 2016-10-08 (×3): qty 1

## 2016-10-08 MED ORDER — METHOCARBAMOL 500 MG PO TABS
500.0000 mg | ORAL_TABLET | Freq: Three times a day (TID) | ORAL | Status: DC | PRN
  Administered 2016-10-08 – 2016-10-09 (×2): 500 mg via ORAL
  Filled 2016-10-08 (×2): qty 1

## 2016-10-08 MED ORDER — OXYCODONE-ACETAMINOPHEN 5-325 MG PO TABS
1.0000 | ORAL_TABLET | ORAL | Status: DC | PRN
  Administered 2016-10-08 – 2016-10-09 (×6): 2 via ORAL
  Filled 2016-10-08 (×6): qty 2

## 2016-10-08 NOTE — Progress Notes (Signed)
PROGRESS NOTE  Derrick Villa ZOX:[id number] DOB: 28-Feb-1965 DOA: 10/02/2016 PCP: Patient, No Pcp Per   LOS: 6 days   Brief Narrative / Interim history: 51 year old male admitted with severe abdominal pain, found to have acute diverticulitis with perforation and associated abscess.  General surgery and interventional radiology were consulted.  Assessment & Plan: Principal Problem:   Diverticulitis of colon with perforation Active Problems:   Acute lower UTI   Diverticulitis   Sepsis due to acute perforated diverticulitis -Meets criteria for sepsis with white count, tachycardia and a source -Continue Zosyn, appreciate surgery and IR input -After initial CT scan on 9/21, patient refused IR to place a drain and wanted to see what conservative management alone would do.  Repeat CT scan on 9/24 shows that the abscess has in fact grown in size and is now agreeable to percutaneous drainage.   -patient underwent CT guide abscess drain by IR 9-26. Antibiotics change to oral by surgery  Monitor on oral antibiotics.   ?  UTI -Patient without urinary symptoms, urine culture is growing however E. coli which is pansensitive.  On bactrim   Hypokalemia; resolved.   DVT prophylaxis: SCD Code Status: Full code Family Communication: No family at bedside this morning Disposition Plan: TBD  Consultants:   General surgery  IR  Procedures:   None   Antimicrobials:  Zosyn 9/21 >>   Subjective: Denies worsening pain. He wants to have drain remove prior to discharge   Objective: Vitals:   10/07/16 1505 10/07/16 2149 10/08/16 0500 10/08/16 1331  BP: (!) 159/83 (!) 149/88 137/82 130/72  Pulse: 85 84 72 62  Resp: Temp: 98.7 F (37.1 C) 98.3 F (36.8 C) 98.4 F (36.9 C) 97.6 F (36.4 C)  TempSrc: Oral Oral Oral Oral  SpO2: 99% 99% 98% 98%  Weight:      Height:        Intake/Output Summary (Last 24 hours) at 10/08/16 1744 Last data filed at 10/08/16 0930  Gross  per 24 hour  Intake          1544.58 ml  Output               29 ml  Net          1515.58 ml   Filed Weights   10/02/16 0042  Weight: 99.8 kg (220 lb)    Examination:  Constitutional: NAD Respiratory: CTA Cardiovascular: S 1, S 2 RRR WJX:BJYN, nt, mild distended. transgluteal drain in place with bloody fluid.   Data Reviewed: I have independently reviewed following labs and imaging studies  CBC:  Recent Labs Lab 10/04/16 1035 10/05/16 0515 10/06/16 0541 10/07/16 0524 10/08/16 0632  WBC 16.2* 15.9* 14.3* 13.1* 9.5  HGB 12.5* 11.4* 10.9* 10.7* 11.8*  HCT 36.2* 33.7* 32.1* 31.5* 34.9*  MCV 87.7 88.2 88.2 88.2 86.6  PLT 294 300 342 356 385   Basic Metabolic Panel:  Recent Labs Lab 10/03/16 0553 10/05/16 0515 10/06/16 0541 10/07/16 0524 10/08/16 0632  NA 136 136 140 138 138  K 3.3* 3.3* 3.6 3.3* 4.0  CL 102 102 105 103 104  CO2 GLUCOSE 98 174* 109* 139* 112*  BUN 11 6 <5* <5* <5*  CREATININE 0.87 0.78 0.70 0.75 0.73  CALCIUM 8.7* 8.5* 8.8* 8.6* 9.2   GFR: Estimated Creatinine Clearance: 133.7 mL/min (by C-G formula based on SCr of 0.73 mg/dL). Liver Function Tests:  Recent Labs Lab 10/02/16 0044  AST 26  ALT 32  ALKPHOS 87  BILITOT 0.8  PROT 8.8*  ALBUMIN 3.9    Recent Labs Lab 10/02/16 0044  LIPASE 22   No results for input(s): AMMONIA in the last 168 hours. Coagulation Profile:  Recent Labs Lab 10/02/16 0938  INR 1.07   Cardiac Enzymes: No results for input(s): CKTOTAL, CKMB, CKMBINDEX, TROPONINI in the last 168 hours. BNP (last 3 results) No results for input(s): PROBNP in the last 8760 hours. HbA1C: No results for input(s): HGBA1C in the last 72 hours. CBG:  Recent Labs Lab 10/02/16 0803 10/03/16 0006  GLUCAP 141* 98   Lipid Profile: No results for input(s): CHOL, HDL, LDLCALC, TRIG, CHOLHDL, LDLDIRECT in the last 72 hours. Thyroid Function Tests: No results for input(s): TSH, T4TOTAL, FREET4, T3FREE,  THYROIDAB in the last 72 hours. Anemia Panel: No results for input(s): VITAMINB12, FOLATE, FERRITIN, TIBC, IRON, RETICCTPCT in the last 72 hours. Urine analysis:    Component Value Date/Time   COLORURINE AMBER (A) 10/02/2016 0135   APPEARANCEUR HAZY (A) 10/02/2016 0135   LABSPEC 1.022 10/02/2016 0135   PHURINE 5.0 10/02/2016 0135   GLUCOSEU NEGATIVE 10/02/2016 0135   HGBUR SMALL (A) 10/02/2016 0135   BILIRUBINUR NEGATIVE 10/02/2016 0135   KETONESUR 5 (A) 10/02/2016 0135   PROTEINUR >=300 (A) 10/02/2016 0135   NITRITE POSITIVE (A) 10/02/2016 0135   LEUKOCYTESUR MODERATE (A) 10/02/2016 0135   Sepsis Labs: Invalid input(s): PROCALCITONIN, LACTICIDVEN  Recent Results (from the past 240 hour(s))  Blood culture (routine x 2)     Status: None   Collection Time: 10/02/16  4:29 AM  Result Value Ref Range Status   Specimen Description BLOOD LEFT ARM  Final   Special Requests   Final    BOTTLES DRAWN AEROBIC AND ANAEROBIC Blood Culture adequate volume   Culture   Final    NO GROWTH 5 DAYS Performed at [facility], [address] [place], [place] [address]    Report Status 10/07/2016 FINAL  Final  Blood culture (routine x 2)     Status: None   Collection Time: 10/02/16  4:53 AM  Result Value Ref Range Status   Specimen Description BLOOD RIGHT HAND  Final   Special Requests IN PEDIATRIC BOTTLE Blood Culture adequate volume  Final   Culture   Final    NO GROWTH 5 DAYS Performed at [facility], [address], [place], [place] [address]    Report Status 10/07/2016 FINAL  Final  Culture, Urine     Status: Abnormal   Collection Time: 10/02/16  6:29 PM  Result Value Ref Range Status   Specimen Description URINE, RANDOM  Final   Special Requests   Final    urine culture add on Performed at [facility], [address] [place], [place] [address]    Culture >=100,000 COLONIES/mL ESCHERICHIA COLI (A)  Final   Report Status 10/05/2016 FINAL  Final   Organism ID,  Bacteria ESCHERICHIA COLI (A)  Final      Susceptibility   Escherichia coli - MIC*    AMPICILLIN <=2 SENSITIVE Sensitive     CEFAZOLIN <=4 SENSITIVE Sensitive     CEFTRIAXONE <=1 SENSITIVE Sensitive     CIPROFLOXACIN <=0.25 SENSITIVE Sensitive     GENTAMICIN <=1 SENSITIVE Sensitive     IMIPENEM <=0.25 SENSITIVE Sensitive     NITROFURANTOIN <=16 SENSITIVE Sensitive     TRIMETH/SULFA <=20 SENSITIVE Sensitive     AMPICILLIN/SULBACTAM <=2 SENSITIVE Sensitive  PIP/TAZO <=4 SENSITIVE Sensitive     Extended ESBL NEGATIVE Sensitive     * >=100,000 COLONIES/mL ESCHERICHIA COLI  Aerobic/Anaerobic Culture (surgical/deep wound)     Status: None (Preliminary result)   Collection Time: 10/06/16  1:36 PM  Result Value Ref Range Status   Specimen Description ABSCESS DRAIN  Final   Special Requests Normal  Final   Gram Stain   Final    ABUNDANT WBC PRESENT, PREDOMINANTLY PMN MODERATE GRAM NEGATIVE RODS    Culture   Final    MODERATE ESCHERICHIA COLI HOLDING FOR POSSIBLE ANAEROBE Performed at [facility], [address] [place], [place] [address]    Report Status PENDING  Incomplete   Organism ID, Bacteria ESCHERICHIA COLI  Final      Susceptibility   Escherichia coli - MIC*    AMPICILLIN >=32 RESISTANT Resistant     CEFAZOLIN <=4 SENSITIVE Sensitive     CEFEPIME <=1 SENSITIVE Sensitive     CEFTAZIDIME <=1 SENSITIVE Sensitive     CEFTRIAXONE <=1 SENSITIVE Sensitive     CIPROFLOXACIN <=0.25 SENSITIVE Sensitive     GENTAMICIN <=1 SENSITIVE Sensitive     IMIPENEM <=0.25 SENSITIVE Sensitive     TRIMETH/SULFA <=20 SENSITIVE Sensitive     AMPICILLIN/SULBACTAM 16 INTERMEDIATE Intermediate     PIP/TAZO <=4 SENSITIVE Sensitive     Extended ESBL NEGATIVE Sensitive     * MODERATE ESCHERICHIA COLI      Radiology Studies: No results found.   Scheduled Meds: . sodium chloride flush  5 mL Intravenous Q8H  . sulfamethoxazole-trimethoprim  1 tablet Oral Q12H   Continuous  Infusions:   Pasha Broad.  [facility] Pager [phone]  If 7PM-7AM, please contact night-coverage [email or web address] Password [facility] 10/08/2016, 5:44 PM

## 2016-10-08 NOTE — Progress Notes (Signed)
Patient ID: Derrick Villa, male   DOB: 1965/03/02, 51 y.o.   MRN: [id number]  [facility] Surgery Progress Note     Subjective: CC- perforated diverticulitis Feeling better today. Denies any current abdominal pain. States that he does have significant rectal pain when medicine wears off. Tolerating full liquids. Denies n/v.  Objective: Vital signs in last 24 hours: Temp:  [98.3 F (36.8 C)-98.7 F (37.1 C)] 98.4 F (36.9 C) (09/27 0500) Pulse Rate:  [72-85] 72 (09/27 0500) Resp:  [18] 18 (09/27 0500) BP: (137-159)/(82-88) 137/82 (09/27 0500) SpO2:  [98 %-99 %] 98 % (09/27 0500) Last BM Date: 10/07/16  Intake/Output from previous day: 09/26 0701 - 09/27 0700 In: 1650.8 [P.O.:300; I.V.:1250.8; IV Piggyback:100] Out: 29 [Urine:4; Drains:25] Intake/Output this shift: No intake/output data recorded.  PE: Gen: Alert, NAD, cooperative HEENT: EOM's intact, pupils equal and round Card: RRR, no M/G/R heard Pulm: CTAB, no W/R/R, effort normal Abd: Soft, distended, +BS, no HSM, no hernia, minimal LLQ TTP with no rebound or guarding. Transgluteal drain in place with minimal serosanguinous/sediment fluid Ext: No erythema, edema, or tenderness BUE/BLE  Psych: A&Ox3  Skin: no rashes noted, warm and dry  Lab Results:   Recent Labs  10/07/16 0524 10/08/16 0632  WBC 13.1* 9.5  HGB 10.7* 11.8*  HCT 31.5* 34.9*  PLT 356 385   BMET  Recent Labs  10/07/16 0524 10/08/16 0632  NA 138 138  K 3.3* 4.0  CL 103 104  CO2 27 25  GLUCOSE 139* 112*  BUN <5* <5*  CREATININE 0.75 0.73  CALCIUM 8.6* 9.2   PT/INR No results for input(s): LABPROT, INR in the last 72 hours. CMP     Component Value Date/Time   NA 138 10/08/2016 0632   K 4.0 10/08/2016 0632   CL 104 10/08/2016 0632   CO2 25 10/08/2016 0632   GLUCOSE 112 (H) 10/08/2016 0632   BUN <5 (L) 10/08/2016 0632   CREATININE 0.73 10/08/2016 0632   CALCIUM 9.2 10/08/2016 0632   PROT 8.8 (H) 10/02/2016 0044   ALBUMIN  3.9 10/02/2016 0044   AST 26 10/02/2016 0044   ALT 32 10/02/2016 0044   ALKPHOS 87 10/02/2016 0044   BILITOT 0.8 10/02/2016 0044   GFRNONAA >60 10/08/2016 0632   GFRAA >60 10/08/2016 0632   Lipase     Component Value Date/Time   LIPASE 22 10/02/2016 0044       Studies/Results: Ct Image Guided Drainage By Percutaneous Catheter  Result Date: 10/06/2016 INDICATION: Diverticular abscess. Please perform CT-guided drainage catheter placement for infection source control purposes. EXAM: CT IMAGE GUIDED DRAINAGE BY PERCUTANEOUS CATHETER COMPARISON:  CT abdomen pelvis - 10/02/2016; pelvic CT - 10/05/2016 MEDICATIONS: The patient is currently admitted to the hospital and receiving intravenous antibiotics. The antibiotics were administered within an appropriate time frame prior to the initiation of the procedure. ANESTHESIA/SEDATION: Moderate (conscious) sedation was employed during this procedure. A total of Versed 4 mg and Fentanyl 200 mcg was administered intravenously. Moderate Sedation Time: 16 minutes. The patient's level of consciousness and vital signs were monitored continuously by radiology nursing throughout the procedure under my direct supervision. CONTRAST:  None COMPLICATIONS: None immediate. PROCEDURE: Informed written consent was obtained from the patient after a discussion of the risks, benefits and alternatives to treatment. The patient was placed prone on the CT gantry and a pre procedural CT was performed re-demonstrating the known abscess/fluid collection within the midline of the lower pelvis. The procedure was planned. A timeout was  performed prior to the initiation of the procedure. The right buttocks was prepped and draped in the usual sterile fashion. The overlying soft tissues were anesthetized with 1% lidocaine with epinephrine. Appropriate trajectory was planned with the use of a 22 gauge spinal needle. An 18 gauge trocar needle was advanced into the abscess/fluid collection  and a short Amplatz super stiff wire was coiled within the collection. Appropriate positioning was confirmed with a limited CT scan. The tract was serially dilated allowing placement of a 10 [place] all-purpose drainage catheter. Appropriate positioning was confirmed with a limited postprocedural CT scan. 80 ml of purulent fluid was aspirated. The tube was connected to a drainage bag and sutured in place. A dressing was placed. The patient tolerated the procedure well without immediate post procedural complication. IMPRESSION: Successful CT guided placement of a 10 French all purpose drain catheter into the midline of lower pelvis via right trans gluteal approach with aspiration of 80 mL of purulent fluid. Samples were sent to the laboratory as requested by the ordering clinical team. Electronically Signed   By: Simonne Come M.D.   On: 10/06/2016 14:41    Anti-infectives: Anti-infectives    Start     Dose/Rate Route Frequency Ordered Stop   10/02/16 1200  piperacillin-tazobactam (ZOSYN) IVPB 3.375 g     3.375 g 12.5 mL/hr over 240 Minutes Intravenous Every 8 hours 10/02/16 0601     10/02/16 0430  vancomycin (VANCOCIN) IVPB 1000 mg/200 mL premix     1,000 mg 200 mL/hr over 60 Minutes Intravenous  Once 10/02/16 0426 10/02/16 0601   10/02/16 0430  piperacillin-tazobactam (ZOSYN) IVPB 3.375 g     3.375 g 100 mL/hr over 30 Minutes Intravenous  Once 10/02/16 0426 10/02/16 0529   10/02/16 0430  doxycycline (VIBRAMYCIN) 100 mg in dextrose 5 % 250 mL IVPB  Status:  Discontinued     100 mg 125 mL/hr over 120 Minutes Intravenous  Once 10/02/16 0426 10/02/16 0553   10/02/16 0215  cefTRIAXone (ROCEPHIN) injection 1 g     1 g Intramuscular  Once 10/02/16 0209 10/02/16 0227       Assessment/Plan UTI - Ucx 9/21 growing E coli Constipation  Hx of tobacco/ETOH use Hypokalemia - resolved  Sigmoid diverticulitis with perforation and abscess - CT scan 9/21 showed Acute perforated diverticulitis with 5.4 cm  abscess in the pelvis and with diffuse free air throughout the abdomen, patient declined IR drainage at that time - repeat scan 9/24 showed persistent inflammatory changes of perforated sigmoid Diverticulitis, increased size of diverticular abscess 5.4 x 5.4 x 6.4 cm - s/p IR drain 9/26. Culture growing E coli - WBC down to 9.5 today, afebrile - drain with 25cc/24hr  ID - zosyn 9/21>>9/27, Bactrim 9/27>>day#1 FEN - IVF, soft diet VTE - SCDs Foley - none  Plan: Pain improving and WBC WNL. Advance to soft diet. Transition to oral antibiotics. Continue drain. Labs in AM. Wean off IV pain medication; added robaxin for better pain control.   LOS: 6 days    Franne Forts , [facility] Surgery 10/08/2016, 10:47 AM Pager: [phone] Consults: [phone] Mon-Fri 7:00 am-4:30 pm Sat-Sun 7:00 am-11:30 am

## 2016-10-09 ENCOUNTER — Other Ambulatory Visit: Payer: Self-pay | Admitting: Radiology

## 2016-10-09 DIAGNOSIS — K5720 Diverticulitis of large intestine with perforation and abscess without bleeding: Secondary | ICD-10-CM

## 2016-10-09 LAB — CBC
HEMATOCRIT: 33 % — AB (ref 39.0–52.0)
HEMOGLOBIN: 11.3 g/dL — AB (ref 13.0–17.0)
MCH: 29.7 pg (ref 26.0–34.0)
MCHC: 34.2 g/dL (ref 30.0–36.0)
MCV: 86.6 fL (ref 78.0–100.0)
Platelets: 371 10*3/uL (ref 150–400)
RBC: 3.81 MIL/uL — ABNORMAL LOW (ref 4.22–5.81)
RDW: 12.3 % (ref 11.5–15.5)
WBC: 8.3 10*3/uL (ref 4.0–10.5)

## 2016-10-09 MED ORDER — METHOCARBAMOL 500 MG PO TABS: 500.0000 mg | ORAL_TABLET | Freq: Three times a day (TID) | ORAL | 0 refills | Status: DC | PRN

## 2016-10-09 MED ORDER — CIPROFLOXACIN HCL 500 MG PO TABS: 500.0000 mg | ORAL_TABLET | Freq: Two times a day (BID) | ORAL | 0 refills | Status: DC

## 2016-10-09 MED ORDER — ENOXAPARIN SODIUM 40 MG/0.4ML ~~LOC~~ SOLN: 40.0000 mg | SUBCUTANEOUS | Status: DC

## 2016-10-09 MED ORDER — OXYCODONE-ACETAMINOPHEN 5-325 MG PO TABS: 1.0000 | ORAL_TABLET | Freq: Four times a day (QID) | ORAL | 0 refills | Status: DC | PRN

## 2016-10-09 MED ORDER — CIPROFLOXACIN HCL 500 MG PO TABS
500.0000 mg | ORAL_TABLET | Freq: Two times a day (BID) | ORAL | Status: DC
  Administered 2016-10-09: 500 mg via ORAL
  Filled 2016-10-09: qty 1

## 2016-10-09 NOTE — Progress Notes (Signed)
cPatient ID: Derrick Villa, male   DOB: 1965/03/22, 51 y.o.   MRN: [id number]  [facility] Surgery Progress Note     Subjective: CC- perforated diverticulitis No complaints today. States that abdominal pain is less today then yesterday, well controlled with percocet and robaxin. Tolerating solid food. Denies n/v. Had a loose BM yesterday.  Objective: Vital signs in last 24 hours: Temp:  [97.6 F (36.4 C)-97.9 F (36.6 C)] 97.9 F (36.6 C) (09/28 0600) Pulse Rate:  [62-72] 70 (09/28 0600) Resp:  [17] 17 (09/28 0600) BP: (113-130)/(61-72) 118/71 (09/28 0600) SpO2:  [98 %-99 %] 99 % (09/28 0600) Last BM Date: 10/07/16  Intake/Output from previous day: 09/27 0701 - 09/28 0700 In: 840 [P.O.:840] Out: 15 [Drains:15] Intake/Output this shift: No intake/output data recorded.  PE: Gen: Alert, NAD HEENT: EOM's intact, pupils equal and round Card: RRR, no M/G/R heard Pulm: CTAB, no W/R/R, effort normal Abd: Soft, mild distension, +BS, no HSM, no hernia, nontender. Transgluteal drain in place with minimal serosanguinous fluid Ext: No erythema, edema, or tenderness BUE/BLE  Psych: A&Ox3  Skin: no rashes noted, warm and dry  Lab Results:   Recent Labs  10/08/16 0632 10/09/16 0541  WBC 9.5 8.3  HGB 11.8* 11.3*  HCT 34.9* 33.0*  PLT 385 371   BMET  Recent Labs  10/07/16 0524 10/08/16 0632  NA 138 138  K 3.3* 4.0  CL 103 104  CO2 27 25  GLUCOSE 139* 112*  BUN <5* <5*  CREATININE 0.75 0.73  CALCIUM 8.6* 9.2   PT/INR No results for input(s): LABPROT, INR in the last 72 hours. CMP     Component Value Date/Time   NA 138 10/08/2016 0632   K 4.0 10/08/2016 0632   CL 104 10/08/2016 0632   CO2 25 10/08/2016 0632   GLUCOSE 112 (H) 10/08/2016 0632   BUN <5 (L) 10/08/2016 0632   CREATININE 0.73 10/08/2016 0632   CALCIUM 9.2 10/08/2016 0632   PROT 8.8 (H) 10/02/2016 0044   ALBUMIN 3.9 10/02/2016 0044   AST 26 10/02/2016 0044   ALT 32 10/02/2016 0044   ALKPHOS 87 10/02/2016 0044   BILITOT 0.8 10/02/2016 0044   GFRNONAA >60 10/08/2016 0632   GFRAA >60 10/08/2016 0632   Lipase     Component Value Date/Time   LIPASE 22 10/02/2016 0044       Studies/Results: No results found.  Anti-infectives: Anti-infectives    Start     Dose/Rate Route Frequency Ordered Stop   10/09/16 0800  ciprofloxacin (CIPRO) tablet 500 mg     500 mg Oral 2 times daily 10/09/16 0759     10/08/16 1200  sulfamethoxazole-trimethoprim (BACTRIM DS,SEPTRA DS) 800-160 MG per tablet 1 tablet  Status:  Discontinued     1 tablet Oral Every 12 hours 10/08/16 1112 10/09/16 0758   10/02/16 1200  piperacillin-tazobactam (ZOSYN) IVPB 3.375 g  Status:  Discontinued     3.375 g 12.5 mL/hr over 240 Minutes Intravenous Every 8 hours 10/02/16 0601 10/08/16 1112   10/02/16 0430  vancomycin (VANCOCIN) IVPB 1000 mg/200 mL premix     1,000 mg 200 mL/hr over 60 Minutes Intravenous  Once 10/02/16 0426 10/02/16 0601   10/02/16 0430  piperacillin-tazobactam (ZOSYN) IVPB 3.375 g     3.375 g 100 mL/hr over 30 Minutes Intravenous  Once 10/02/16 0426 10/02/16 0529   10/02/16 0430  doxycycline (VIBRAMYCIN) 100 mg in dextrose 5 % 250 mL IVPB  Status:  Discontinued  100 mg 125 mL/hr over 120 Minutes Intravenous  Once 10/02/16 0426 10/02/16 0553   10/02/16 0215  cefTRIAXone (ROCEPHIN) injection 1 g     1 g Intramuscular  Once 10/02/16 0209 10/02/16 0227       Assessment/Plan UTI - Ucx 9/21 growing E coli Constipation  Hx of tobacco/ETOH use Hypokalemia - resolved  Sigmoid diverticulitis with perforation and abscess - CT scan 9/21 showed Acute perforated diverticulitis with 5.4 cm abscess in the pelvis and with diffuse free air throughout the abdomen, patient declined IR drainage at that time - repeat scan 9/24 showed persistent inflammatory changes of perforated sigmoid Diverticulitis, increased size of diverticular abscess 5.4 x 5.4 x 6.4 cm - s/p IR drain 9/26. Culture  growing E coli - WBC WNL 8.3, afebrile - drain with 15cc/24hr  ID - zosyn 9/21>>9/27, Bactrim 9/27>>9/28, cipro 9/28>>day#1 FEN - soft diet VTE - SCDs, lovenox Foley - none  Plan: Patient ready for discharge from our standpoint. Recommend going home with drain and PO antibiotics, follow-up with drain clinic next week and then with surgeon. Will work on f/u.   LOS: 7 days    Franne Forts , [facility] 10/09/2016, 8:23 AM Pager: [phone] Consults: [phone] Mon-Fri 7:00 am-4:30 pm Sat-Sun 7:00 am-11:30 am

## 2016-10-09 NOTE — Progress Notes (Signed)
Referring Physician(s): Connor,C  Supervising Physician: Gilmer Mor  Patient Status:  [name] - In-pt  Chief Complaint:  Pelvic abscess  Subjective: Patient without new complaints; awaiting DC home; still has some soreness at right transgluteal drain site   Allergies: Patient has no known allergies.  Medications: Prior to Admission medications   Medication Sig Start Date End Date Taking? Authorizing Provider  ibuprofen (MOTRIN IB) 200 MG tablet Take 3 tablets (600 mg total) by mouth every 6 (six) hours as needed for pain. Patient taking differently: Take 400 mg by mouth every 6 (six) hours as needed for pain. Pain 08/19/12  Yes Francoise Schaumann, Megan N, PA-C  ciprofloxacin (CIPRO) 500 MG tablet Take 1 tablet (500 mg total) by mouth 2 (two) times daily. 10/09/16   Regalado, Belkys A, MD  doxycycline (VIBRAMYCIN) 100 MG capsule Take 1 capsule (100 mg total) by mouth 2 (two) times daily. Patient not taking: Reported on 10/02/2016 05/29/15   Benjiman Core, MD  Ipratropium-Albuterol (COMBIVENT) 20-100 MCG/ACT AERS respimat Inhale 2 puffs into the lungs every 6 (six) hours as needed for wheezing. Patient not taking: Reported on 05/29/2015 08/19/12   Nonie Hoyer, PA-C  methocarbamol (ROBAXIN) 500 MG tablet Take 1.5 tablets (750 mg total) by mouth every 6 (six) hours as needed. Patient not taking: Reported on 05/29/2015 08/19/12   Nonie Hoyer, PA-C  methocarbamol (ROBAXIN) 500 MG tablet Take 1 tablet (500 mg total) by mouth every 8 (eight) hours as needed for muscle spasms. 10/09/16   Regalado, Belkys A, MD  oxyCODONE (OXY IR/ROXICODONE) 5 MG immediate release tablet Take 1-3 tablets (5-15 mg total) by mouth every 4 (four) hours as needed (Take  for mild pain,  for moderate pain,  for severe pain). Patient not taking: Reported on 05/29/2015 08/19/12   Nonie Hoyer, PA-C  oxyCODONE-acetaminophen (PERCOCET/ROXICET) 5-325 MG tablet Take 1-2 tablets by mouth every 6 (six) hours as needed for  severe pain. 10/09/16   Regalado, Jon Billings A, MD     Vital Signs: BP 118/71 (BP Location: Right Arm)   Pulse 70   Temp 97.9 F (36.6 C) (Oral)   Resp 17   Ht 6' (1.829 m)   Wt 220 lb (99.8 kg)   SpO2 99%   BMI 29.84 kg/m   Physical Exam right transgluteal drain intact, dressing dry, site mildly tender to palpation, output 15 mL turbid,  blood-tinged fluid; drain irrigated with sterile normal saline without difficulty.  Imaging: Ct Image Guided Drainage By Percutaneous Catheter  Result Date: 10/06/2016 INDICATION: Diverticular abscess. Please perform CT-guided drainage catheter placement for infection source control purposes. EXAM: CT IMAGE GUIDED DRAINAGE BY PERCUTANEOUS CATHETER COMPARISON:  CT abdomen pelvis - 10/02/2016; pelvic CT - 10/05/2016 MEDICATIONS: The patient is currently admitted to the hospital and receiving intravenous antibiotics. The antibiotics were administered within an appropriate time frame prior to the initiation of the procedure. ANESTHESIA/SEDATION: Moderate (conscious) sedation was employed during this procedure. A total of Versed 4 mg and Fentanyl 200 mcg was administered intravenously. Moderate Sedation Time: 16 minutes. The patient's level of consciousness and vital signs were monitored continuously by radiology nursing throughout the procedure under my direct supervision. CONTRAST:  None COMPLICATIONS: None immediate. PROCEDURE: Informed written consent was obtained from the patient after a discussion of the risks, benefits and alternatives to treatment. The patient was placed prone on the CT gantry and a pre procedural CT was performed re-demonstrating the known abscess/fluid collection within the midline of the lower pelvis. The  procedure was planned. A timeout was performed prior to the initiation of the procedure. The right buttocks was prepped and draped in the usual sterile fashion. The overlying soft tissues were anesthetized with 1% lidocaine with epinephrine.  Appropriate trajectory was planned with the use of a 22 gauge spinal needle. An 18 gauge trocar needle was advanced into the abscess/fluid collection and a short Amplatz super stiff wire was coiled within the collection. Appropriate positioning was confirmed with a limited CT scan. The tract was serially dilated allowing placement of a 10 [place] all-purpose drainage catheter. Appropriate positioning was confirmed with a limited postprocedural CT scan. 80 ml of purulent fluid was aspirated. The tube was connected to a drainage bag and sutured in place. A dressing was placed. The patient tolerated the procedure well without immediate post procedural complication. IMPRESSION: Successful CT guided placement of a 10 French all purpose drain catheter into the midline of lower pelvis via right trans gluteal approach with aspiration of 80 mL of purulent fluid. Samples were sent to the laboratory as requested by the ordering clinical team. Electronically Signed   By: Derrick Villa M.D.   On: 10/06/2016 14:41    Labs:  CBC:  Recent Labs  10/06/16 0541 10/07/16 0524 10/08/16 0632 10/09/16 0541  WBC 14.3* 13.1* 9.5 8.3  HGB 10.9* 10.7* 11.8* 11.3*  HCT 32.1* 31.5* 34.9* 33.0*  PLT 342 356 385 371    COAGS:  Recent Labs  10/02/16 0938  INR 1.07    BMP:  Recent Labs  10/05/16 0515 10/06/16 0541 10/07/16 0524 10/08/16 0632  NA 136 140 138 138  K 3.3* 3.6 3.3* 4.0  CL 102 105 103 104  CO2 GLUCOSE 174* 109* 139* 112*  BUN 6 <5* <5* <5*  CALCIUM 8.5* 8.8* 8.6* 9.2  CREATININE 0.78 0.70 0.75 0.73  GFRNONAA >60 >60 >60 >60  GFRAA >60 >60 >60 >60    LIVER FUNCTION TESTS:  Recent Labs  10/02/16 0044  BILITOT 0.8  AST 26  ALT 32  ALKPHOS 87  PROT 8.8*  ALBUMIN 3.9    Assessment and Plan: Patient with history of diverticular abscess, status post CT-guided drainage on 9/25; afebrile, WBC normal, hemoglobin stable, creatinine normal; drain fluid culture showing  Escherichia coli; patient to be DC'd home today per [facility]; antibiotics per [facility];  As OP recommend once daily irrigation of drain with 5 mL sterile normal saline, recording of drain output and dressing changes as needed. Patient will be scheduled for follow-up CT with possible drain injection next week in IR clinic [phone]). Our staff will contact patient regarding time for appointment.   Electronically Signed: D. Jeananne Rama, PA-C 10/09/2016, 11:24 AM   I spent a total of 15 minutes at the the patient's bedside AND on the patient's hospital floor or unit, greater than 50% of which was counseling/coordinating care for pelvic abscess drain    Patient ID: Derrick Villa, male   DOB: 1965-03-01, 51 y.o.   MRN: [id number]

## 2016-10-09 NOTE — Progress Notes (Signed)
Received a call that patient was needing assistance with medications. Spoke with patient at bedside, patient very angry. Discussed Match program, unable to cover narcotics, antibiotic is affordable as it will likely be around $10. Patient states he is able to afford that but is not happy that we can't pay for his narcotics. States he just wants to leave and get back to work. Discussed drain care and need to record OP, patient states that's not his job and he will not do it. No further concerns at this time. [phone]

## 2016-10-09 NOTE — Progress Notes (Signed)
Pt ready for discharge with drain in place. Reviewed instructions on flushing drain daily and recording output when drain bag is emptied.  Reviewed prescriptions and f/u appointments. Pt given flushes and dressing change supplies for home use.

## 2016-10-09 NOTE — Discharge Summary (Signed)
Physician Discharge Summary  SOSAIA PITTINGER ZOX:[id number] DOB: [birth year] DOA: 10/02/2016  PCP: Patient, No Pcp Per  Admit date: 10/02/2016 Discharge date: 10/09/2016  Admitted From: Home  Disposition:  Home   Recommendations for Outpatient Follow-up:  1. Follow up with PCP in 1-2 weeks 2. Please obtain BMP/CBC in one week 3. Needs to follow up with IR, Drain clinic.  4. Follow up with Surgery     Discharge Condition: Stable.  CODE STATUS: Full code.  Diet recommendation: Heart Healthy   Brief/Interim Summary:  Brief Narrative / Interim history: 51 year old male admitted with severe abdominal pain, found to have acute diverticulitis with perforation and associated abscess. General surgery and interventional radiology were consulted.  Assessment & Plan: Principal Problem:   Diverticulitis of colon with perforation Active Problems:   Acute lower UTI   Diverticulitis   Sepsis due to acute perforated diverticulitis -Meets criteria for sepsis with white count, tachycardia and a source -Continue Zosyn, appreciate surgery and IR input -After initial CT scan on 9/21, patient refused IR to place a drain and wanted to see what conservative management alone would do.  Repeat CT scan on 9/24 shows that the abscess has in fact grown in size and is now agreeable to percutaneous drainage.   -Patient underwent CT guide abscess drain by IR 9-26. Antibiotics change to oral by surgery , he will be discharge on cipro, will provide antibiotics for 2 weeks.  Needs to follow up with IR clinic and Surgery.  I have prescribe short course of pain medication.  Monitor on oral antibiotics.   UTI -Patient without urinary symptoms, urine culture is growing however E. coli which is pansensitive.  On bactrim   Hypokalemia; resolved.   Discharge Diagnoses:  Principal Problem:   Diverticulitis of colon with perforation Active Problems:   Acute lower UTI   Diverticulitis    Discharge  Instructions  Discharge Instructions    Diet - low sodium heart healthy    Complete by:  As directed    Increase activity slowly    Complete by:  As directed      Allergies as of 10/09/2016   No Known Allergies     Medication List    STOP taking these medications   doxycycline 100 MG capsule Commonly known as:  VIBRAMYCIN   oxyCODONE 5 MG immediate release tablet Commonly known as:  Oxy IR/ROXICODONE     TAKE these medications   ciprofloxacin 500 MG tablet Commonly known as:  CIPRO Take 1 tablet (500 mg total) by mouth 2 (two) times daily.   ibuprofen 200 MG tablet Commonly known as:  MOTRIN IB Take 3 tablets (600 mg total) by mouth every 6 (six) hours as needed for pain. What changed:  how much to take  additional instructions   Ipratropium-Albuterol 20-100 MCG/ACT Aers respimat Commonly known as:  COMBIVENT Inhale 2 puffs into the lungs every 6 (six) hours as needed for wheezing.   methocarbamol 500 MG tablet Commonly known as:  ROBAXIN Take 1 tablet (500 mg total) by mouth every 8 (eight) hours as needed for muscle spasms. What changed:  how much to take  when to take this  reasons to take this   oxyCODONE-acetaminophen 5-325 MG tablet Commonly known as:  PERCOCET/ROXICET Take 1-2 tablets by mouth every 6 (six) hours as needed for severe pain.            Discharge Care Instructions        Start  Ordered   10/09/16 0000  ciprofloxacin (CIPRO) 500 MG tablet  2 times daily     10/09/16 1013   10/09/16 0000  methocarbamol (ROBAXIN) 500 MG tablet  Every 8 hours PRN     10/09/16 1013   10/09/16 0000  oxyCODONE-acetaminophen (PERCOCET/ROXICET) 5-325 MG tablet  Every 6 hours PRN     10/09/16 1013   10/09/16 0000  Increase activity slowly     10/09/16 1013   10/09/16 0000  Diet - low sodium heart healthy     10/09/16 1013     Follow-up Information    Andria Meuse, MD. Go on 10/23/2016.   Specialty:  General Surgery Why:  Your  appointment is 10/20/2016 at 9AM. Please arrive 30 minutes prior to your appointment to check in and fill out paperwork. Contact information: [address] [place] [address] [phone]          No Known Allergies  Consultations:  Surgery    Procedures/Studies: Ct Pelvis W Contrast  Result Date: 10/05/2016 CLINICAL DATA:  Diverticulitis follow-up EXAM: CT PELVIS WITH CONTRAST TECHNIQUE: Multidetector CT imaging of the pelvis was performed using the standard protocol following the bolus administration of intravenous contrast. CONTRAST:  ISOVUE-300 IOPAMIDOL (ISOVUE-300) INJECTION 61% COMPARISON:  10/02/2016 FINDINGS: Urinary Tract:  Urinary bladder appears normal. Bowel: Extensive distal colonic diverticulosis is again noted. Abnormal wall thickening and inflammation involving the sigmoid colon is again identified compatible with acute diverticulitis. There has been interval increase in volume of the diverticular abscess within the central pelvis. This measures 5.4 x 5.4 x 6.4 cm (volume = 98 cm^3). Previously 5.8 x 4.5 x 4.3 cm (volume = 59 cm^3). Vascular/Lymphatic: Aortic atherosclerosis. Prominent right external iliac node measures 7 mm. Reproductive:  No mass or other significant abnormality Other:  None. Musculoskeletal: No suspicious bone lesions identified. IMPRESSION: 1. Persistent inflammatory changes of perforated sigmoid diverticulitis. 2. Diverticular abscess has increased in volume from previous exam. Currently approximately 98 cc versus 59 cc previously. Electronically Signed   By: Signa Kell M.D.   On: 10/05/2016 11:45   Ct Abdomen Pelvis W Contrast  Result Date: 10/02/2016 CLINICAL DATA:  Abdominal pain. Sharp rectal pain. Constipation. Elevated white cell count. EXAM: CT ABDOMEN AND PELVIS WITH CONTRAST TECHNIQUE: Multidetector CT imaging of the abdomen and pelvis was performed using the standard protocol following bolus administration of intravenous  contrast. CONTRAST:  100 mL Isovue-300 COMPARISON:  08/17/2012 FINDINGS: Lower chest: Lung bases are clear. Focal fluid or scarring in the left base at the fissure. Hepatobiliary: Mild diffuse fatty infiltration of the liver. No focal liver lesions. Gallbladder and bile ducts are unremarkable. Pancreas: Unremarkable. No pancreatic ductal dilatation or surrounding inflammatory changes. Spleen: Normal in size without focal abnormality. Adrenals/Urinary Tract: Adrenal glands are unremarkable. Kidneys are normal, without renal calculi, focal lesion, or hydronephrosis. Bladder is unremarkable. No bladder wall thickening or intraluminal gas. Stomach/Bowel: Diverticulosis of the sigmoid colon with sigmoid colonic wall thickening and stranding in the pelvic fat consistent with acute diverticulitis. There is a an abscess adjacent to the rectosigmoid junction measuring 5.4 cm diameter. There is diffuse free intra-abdominal air. Stomach and small bowel are unremarkable. Appendix is normal. Vascular/Lymphatic: Aortic atherosclerosis. No enlarged abdominal or pelvic lymph nodes. Reproductive: Prostate is unremarkable. Other: Abdominal wall musculature appears intact. No free fluid in the abdomen. Musculoskeletal: No acute or significant osseous findings. IMPRESSION: Acute perforated diverticulitis with 5.4 cm abscess in the pelvis and with diffuse free air throughout the abdomen.  These results were called by telephone at the time of interpretation on 10/02/2016 at 4:18 am to Dr. Cy Blamer , who verbally acknowledged these results. Electronically Signed   By: Burman Nieves M.D.   On: 10/02/2016 04:25   Ct Image Guided Drainage By Percutaneous Catheter  Result Date: 10/06/2016 INDICATION: Diverticular abscess. Please perform CT-guided drainage catheter placement for infection source control purposes. EXAM: CT IMAGE GUIDED DRAINAGE BY PERCUTANEOUS CATHETER COMPARISON:  CT abdomen pelvis - 10/02/2016; pelvic CT -  10/05/2016 MEDICATIONS: The patient is currently admitted to the hospital and receiving intravenous antibiotics. The antibiotics were administered within an appropriate time frame prior to the initiation of the procedure. ANESTHESIA/SEDATION: Moderate (conscious) sedation was employed during this procedure. A total of Versed 4 mg and Fentanyl 200 mcg was administered intravenously. Moderate Sedation Time: 16 minutes. The patient's level of consciousness and vital signs were monitored continuously by radiology nursing throughout the procedure under my direct supervision. CONTRAST:  None COMPLICATIONS: None immediate. PROCEDURE: Informed written consent was obtained from the patient after a discussion of the risks, benefits and alternatives to treatment. The patient was placed prone on the CT gantry and a pre procedural CT was performed re-demonstrating the known abscess/fluid collection within the midline of the lower pelvis. The procedure was planned. A timeout was performed prior to the initiation of the procedure. The right buttocks was prepped and draped in the usual sterile fashion. The overlying soft tissues were anesthetized with 1% lidocaine with epinephrine. Appropriate trajectory was planned with the use of a 22 gauge spinal needle. An 18 gauge trocar needle was advanced into the abscess/fluid collection and a short Amplatz super stiff wire was coiled within the collection. Appropriate positioning was confirmed with a limited CT scan. The tract was serially dilated allowing placement of a 10 [place] all-purpose drainage catheter. Appropriate positioning was confirmed with a limited postprocedural CT scan. 80 ml of purulent fluid was aspirated. The tube was connected to a drainage bag and sutured in place. A dressing was placed. The patient tolerated the procedure well without immediate post procedural complication. IMPRESSION: Successful CT guided placement of a 10 French all purpose drain catheter into the  midline of lower pelvis via right trans gluteal approach with aspiration of 80 mL of purulent fluid. Samples were sent to the laboratory as requested by the ordering clinical team. Electronically Signed   By: Simonne Come M.D.   On: 10/06/2016 14:41       Subjective: Abdominal pain is better, tolerating diet.   Discharge Exam: Vitals:   10/08/16 2144 10/09/16 0600  BP: 113/61 118/71  Pulse: 72 70  Resp: 17 17  Temp: 97.9 F (36.6 C) 97.9 F (36.6 C)  SpO2: 99% 99%   Vitals:   10/08/16 0500 10/08/16 1331 10/08/16 2144 10/09/16 0600  BP: 137/82 130/72 113/61 118/71  Pulse: 72 62 72 70  Resp: 18 17 17 17   Temp: 98.4 F (36.9 C) 97.6 F (36.4 C) 97.9 F (36.6 C) 97.9 F (36.6 C)  TempSrc: Oral Oral Oral Oral  SpO2: 98% 98% 99% 99%  Weight:      Height:        General: Pt is alert, awake, not in acute distress Cardiovascular: RRR, S1/S2 +, no rubs, no gallops Respiratory: CTA bilaterally, no wheezing, no rhonchi Abdominal: Soft, NT, ND, bowel sounds + drain in place.  Extremities: no edema, no cyanosis    The results of significant diagnostics from this hospitalization (including imaging, microbiology, ancillary  and laboratory) are listed below for reference.     Microbiology: Recent Results (from the past 240 hour(s))  Blood culture (routine x 2)     Status: None   Collection Time: 10/02/16  4:29 AM  Result Value Ref Range Status   Specimen Description BLOOD LEFT ARM  Final   Special Requests   Final    BOTTLES DRAWN AEROBIC AND ANAEROBIC Blood Culture adequate volume   Culture   Final    NO GROWTH 5 DAYS Performed at [facility], [address], [place], [place] [address]    Report Status 10/07/2016 FINAL  Final  Blood culture (routine x 2)     Status: None   Collection Time: 10/02/16  4:53 AM  Result Value Ref Range Status   Specimen Description BLOOD RIGHT HAND  Final   Special Requests IN PEDIATRIC BOTTLE Blood Culture adequate volume  Final    Culture   Final    NO GROWTH 5 DAYS Performed at [facility], [address], [place], [place] [address]    Report Status 10/07/2016 FINAL  Final  Culture, Urine     Status: Abnormal   Collection Time: 10/02/16  6:29 PM  Result Value Ref Range Status   Specimen Description URINE, RANDOM  Final   Special Requests   Final    urine culture add on Performed at [facility], [address], [place], [place] [address]    Culture >=100,000 COLONIES/mL ESCHERICHIA COLI (A)  Final   Report Status 10/05/2016 FINAL  Final   Organism ID, Bacteria ESCHERICHIA COLI (A)  Final      Susceptibility   Escherichia coli - MIC*    AMPICILLIN <=2 SENSITIVE Sensitive     CEFAZOLIN <=4 SENSITIVE Sensitive     CEFTRIAXONE <=1 SENSITIVE Sensitive     CIPROFLOXACIN <=0.25 SENSITIVE Sensitive     GENTAMICIN <=1 SENSITIVE Sensitive     IMIPENEM <=0.25 SENSITIVE Sensitive     NITROFURANTOIN <=16 SENSITIVE Sensitive     TRIMETH/SULFA <=20 SENSITIVE Sensitive     AMPICILLIN/SULBACTAM <=2 SENSITIVE Sensitive     PIP/TAZO <=4 SENSITIVE Sensitive     Extended ESBL NEGATIVE Sensitive     * >=100,000 COLONIES/mL ESCHERICHIA COLI  Aerobic/Anaerobic Culture (surgical/deep wound)     Status: None (Preliminary result)   Collection Time: 10/06/16  1:36 PM  Result Value Ref Range Status   Specimen Description ABSCESS DRAIN  Final   Special Requests Normal  Final   Gram Stain   Final    ABUNDANT WBC PRESENT, PREDOMINANTLY PMN MODERATE GRAM NEGATIVE RODS    Culture   Final    MODERATE ESCHERICHIA COLI HOLDING FOR POSSIBLE ANAEROBE Performed at [facility], [address], [place], [place] [address]    Report Status PENDING  Incomplete   Organism ID, Bacteria ESCHERICHIA COLI  Final      Susceptibility   Escherichia coli - MIC*    AMPICILLIN >=32 RESISTANT Resistant     CEFAZOLIN <=4 SENSITIVE Sensitive     CEFEPIME <=1 SENSITIVE Sensitive     CEFTAZIDIME <=1 SENSITIVE Sensitive      CEFTRIAXONE <=1 SENSITIVE Sensitive     CIPROFLOXACIN <=0.25 SENSITIVE Sensitive     GENTAMICIN <=1 SENSITIVE Sensitive     IMIPENEM <=0.25 SENSITIVE Sensitive     TRIMETH/SULFA <=20 SENSITIVE Sensitive     AMPICILLIN/SULBACTAM 16 INTERMEDIATE Intermediate     PIP/TAZO <=4 SENSITIVE Sensitive     Extended  ESBL NEGATIVE Sensitive     * MODERATE ESCHERICHIA COLI     Labs: BNP (last 3 results) No results for input(s): BNP in the last 8760 hours. Basic Metabolic Panel:  Recent Labs Lab 10/03/16 0553 10/05/16 0515 10/06/16 0541 10/07/16 0524 10/08/16 0632  NA 136 136 140 138 138  K 3.3* 3.3* 3.6 3.3* 4.0  CL 102 102 105 103 104  CO2 GLUCOSE 98 174* 109* 139* 112*  BUN 11 6 <5* <5* <5*  CREATININE 0.87 0.78 0.70 0.75 0.73  CALCIUM 8.7* 8.5* 8.8* 8.6* 9.2   Liver Function Tests: No results for input(s): AST, ALT, ALKPHOS, BILITOT, PROT, ALBUMIN in the last 168 hours. No results for input(s): LIPASE, AMYLASE in the last 168 hours. No results for input(s): AMMONIA in the last 168 hours. CBC:  Recent Labs Lab 10/05/16 0515 10/06/16 0541 10/07/16 0524 10/08/16 0632 10/09/16 0541  WBC 15.9* 14.3* 13.1* 9.5 8.3  HGB 11.4* 10.9* 10.7* 11.8* 11.3*  HCT 33.7* 32.1* 31.5* 34.9* 33.0*  MCV 88.2 88.2 88.2 86.6 86.6  PLT 300 342 356 385 371   Cardiac Enzymes: No results for input(s): CKTOTAL, CKMB, CKMBINDEX, TROPONINI in the last 168 hours. BNP: Invalid input(s): POCBNP CBG:  Recent Labs Lab 10/03/16 0006  GLUCAP 98   D-Dimer No results for input(s): DDIMER in the last 72 hours. Hgb A1c No results for input(s): HGBA1C in the last 72 hours. Lipid Profile No results for input(s): CHOL, HDL, LDLCALC, TRIG, CHOLHDL, LDLDIRECT in the last 72 hours. Thyroid function studies No results for input(s): TSH, T4TOTAL, T3FREE, THYROIDAB in the last 72 hours.  Invalid input(s): FREET3 Anemia work up No results for input(s): VITAMINB12, FOLATE, FERRITIN, TIBC,  IRON, RETICCTPCT in the last 72 hours. Urinalysis    Component Value Date/Time   COLORURINE AMBER (A) 10/02/2016 0135   APPEARANCEUR HAZY (A) 10/02/2016 0135   LABSPEC 1.022 10/02/2016 0135   PHURINE 5.0 10/02/2016 0135   GLUCOSEU NEGATIVE 10/02/2016 0135   HGBUR SMALL (A) 10/02/2016 0135   BILIRUBINUR NEGATIVE 10/02/2016 0135   KETONESUR 5 (A) 10/02/2016 0135   PROTEINUR >=300 (A) 10/02/2016 0135   NITRITE POSITIVE (A) 10/02/2016 0135   LEUKOCYTESUR MODERATE (A) 10/02/2016 0135   Sepsis Labs Invalid input(s): PROCALCITONIN,  WBC,  LACTICIDVEN Microbiology Recent Results (from the past 240 hour(s))  Blood culture (routine x 2)     Status: None   Collection Time: 10/02/16  4:29 AM  Result Value Ref Range Status   Specimen Description BLOOD LEFT ARM  Final   Special Requests   Final    BOTTLES DRAWN AEROBIC AND ANAEROBIC Blood Culture adequate volume   Culture   Final    NO GROWTH 5 DAYS Performed at [facility], [address], [place], [place] [address]    Report Status 10/07/2016 FINAL  Final  Blood culture (routine x 2)     Status: None   Collection Time: 10/02/16  4:53 AM  Result Value Ref Range Status   Specimen Description BLOOD RIGHT HAND  Final   Special Requests IN PEDIATRIC BOTTLE Blood Culture adequate volume  Final   Culture   Final    NO GROWTH 5 DAYS Performed at [facility], [address], [place], [place] [address]    Report Status 10/07/2016 FINAL  Final  Culture, Urine     Status: Abnormal   Collection Time: 10/02/16  6:29 PM  Result Value Ref Range Status  Specimen Description URINE, RANDOM  Final   Special Requests   Final    urine culture add on Performed at [facility], [address] [place], [place] [address]    Culture >=100,000 COLONIES/mL ESCHERICHIA COLI (A)  Final   Report Status 10/05/2016 FINAL  Final   Organism ID, Bacteria ESCHERICHIA COLI (A)  Final      Susceptibility   Escherichia coli - MIC*     AMPICILLIN <=2 SENSITIVE Sensitive     CEFAZOLIN <=4 SENSITIVE Sensitive     CEFTRIAXONE <=1 SENSITIVE Sensitive     CIPROFLOXACIN <=0.25 SENSITIVE Sensitive     GENTAMICIN <=1 SENSITIVE Sensitive     IMIPENEM <=0.25 SENSITIVE Sensitive     NITROFURANTOIN <=16 SENSITIVE Sensitive     TRIMETH/SULFA <=20 SENSITIVE Sensitive     AMPICILLIN/SULBACTAM <=2 SENSITIVE Sensitive     PIP/TAZO <=4 SENSITIVE Sensitive     Extended ESBL NEGATIVE Sensitive     * >=100,000 COLONIES/mL ESCHERICHIA COLI  Aerobic/Anaerobic Culture (surgical/deep wound)     Status: None (Preliminary result)   Collection Time: 10/06/16  1:36 PM  Result Value Ref Range Status   Specimen Description ABSCESS DRAIN  Final   Special Requests Normal  Final   Gram Stain   Final    ABUNDANT WBC PRESENT, PREDOMINANTLY PMN MODERATE GRAM NEGATIVE RODS    Culture   Final    MODERATE ESCHERICHIA COLI HOLDING FOR POSSIBLE ANAEROBE Performed at [facility], [address], [place], [place] [address]    Report Status PENDING  Incomplete   Organism ID, Bacteria ESCHERICHIA COLI  Final      Susceptibility   Escherichia coli - MIC*    AMPICILLIN >=32 RESISTANT Resistant     CEFAZOLIN <=4 SENSITIVE Sensitive     CEFEPIME <=1 SENSITIVE Sensitive     CEFTAZIDIME <=1 SENSITIVE Sensitive     CEFTRIAXONE <=1 SENSITIVE Sensitive     CIPROFLOXACIN <=0.25 SENSITIVE Sensitive     GENTAMICIN <=1 SENSITIVE Sensitive     IMIPENEM <=0.25 SENSITIVE Sensitive     TRIMETH/SULFA <=20 SENSITIVE Sensitive     AMPICILLIN/SULBACTAM 16 INTERMEDIATE Intermediate     PIP/TAZO <=4 SENSITIVE Sensitive     Extended ESBL NEGATIVE Sensitive     * MODERATE ESCHERICHIA COLI     Time coordinating discharge: Over 30 minutes  SIGNED:   Alba Cory, MD  [facility] 10/09/2016, 10:13 AM Pager   If 7PM-7AM, please contact night-coverage [email or web address] Password [email or web address]

## 2016-10-11 LAB — AEROBIC/ANAEROBIC CULTURE W GRAM STAIN (SURGICAL/DEEP WOUND): Special Requests: NORMAL

## 2016-10-11 LAB — AEROBIC/ANAEROBIC CULTURE (SURGICAL/DEEP WOUND)

## 2016-10-12 ENCOUNTER — Other Ambulatory Visit: Payer: Self-pay | Admitting: General Surgery

## 2016-10-12 DIAGNOSIS — K5720 Diverticulitis of large intestine with perforation and abscess without bleeding: Secondary | ICD-10-CM

## 2016-10-15 ENCOUNTER — Ambulatory Visit
Admission: RE | Admit: 2016-10-15 | Discharge: 2016-10-15 | Disposition: A | Discharge status: Home / Self Care | Payer: Self-pay | Source: Ambulatory Visit | Attending: General Surgery | Admitting: General Surgery

## 2016-10-15 ENCOUNTER — Other Ambulatory Visit: Payer: Self-pay | Admitting: General Surgery

## 2016-10-15 ENCOUNTER — Ambulatory Visit
Admission: RE | Admit: 2016-10-15 | Discharge: 2016-10-15 | Disposition: A | Discharge status: Home / Self Care | Payer: Self-pay | Source: Ambulatory Visit | Attending: Radiology | Admitting: Radiology

## 2016-10-15 DIAGNOSIS — K5720 Diverticulitis of large intestine with perforation and abscess without bleeding: Secondary | ICD-10-CM

## 2016-10-15 HISTORY — PX: IR RADIOLOGIST EVAL & MGMT: IMG5224

## 2016-10-15 MED ORDER — IOPAMIDOL (ISOVUE-300) INJECTION 61%
125.0000 mL | Freq: Once | INTRAVENOUS | Status: AC | PRN
  Administered 2016-10-15: 125 mL via INTRAVENOUS
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
## 2016-10-19 LAB — HIV ANTIBODY (ROUTINE TESTING W REFLEX): HIV SCREEN 4TH GENERATION: NONREACTIVE

## 2016-10-29 ENCOUNTER — Ambulatory Visit
Admission: RE | Admit: 2016-10-29 | Discharge: 2016-10-29 | Disposition: A | Discharge status: Home / Self Care | Payer: Self-pay | Source: Ambulatory Visit | Attending: General Surgery | Admitting: General Surgery

## 2016-10-29 ENCOUNTER — Other Ambulatory Visit: Payer: Self-pay | Admitting: General Surgery

## 2016-10-29 DIAGNOSIS — K5720 Diverticulitis of large intestine with perforation and abscess without bleeding: Secondary | ICD-10-CM

## 2016-10-29 HISTORY — PX: IR RADIOLOGIST EVAL & MGMT: IMG5224
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
## 2016-11-11 ENCOUNTER — Encounter: Payer: Self-pay | Admitting: Interventional Radiology

## 2016-11-12 ENCOUNTER — Ambulatory Visit
Admission: RE | Admit: 2016-11-12 | Discharge: 2016-11-12 | Disposition: A | Discharge status: Home / Self Care | Payer: No Typology Code available for payment source | Source: Ambulatory Visit | Attending: General Surgery | Admitting: General Surgery

## 2016-11-12 ENCOUNTER — Other Ambulatory Visit: Payer: Self-pay | Admitting: General Surgery

## 2016-11-12 DIAGNOSIS — K5720 Diverticulitis of large intestine with perforation and abscess without bleeding: Secondary | ICD-10-CM

## 2016-11-12 HISTORY — PX: IR RADIOLOGIST EVAL & MGMT: IMG5224
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
## 2016-11-26 ENCOUNTER — Ambulatory Visit
Admission: RE | Admit: 2016-11-26 | Discharge: 2016-11-26 | Disposition: A | Discharge status: Home / Self Care | Payer: Self-pay | Source: Ambulatory Visit | Attending: General Surgery | Admitting: General Surgery

## 2016-11-26 ENCOUNTER — Other Ambulatory Visit: Payer: Self-pay | Admitting: General Surgery

## 2016-11-26 DIAGNOSIS — K5720 Diverticulitis of large intestine with perforation and abscess without bleeding: Secondary | ICD-10-CM

## 2016-11-26 HISTORY — PX: IR RADIOLOGIST EVAL & MGMT: IMG5224
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
## 2016-12-02 ENCOUNTER — Encounter: Payer: Self-pay | Admitting: Interventional Radiology

## 2016-12-21 ENCOUNTER — Emergency Department (HOSPITAL_COMMUNITY)
Admission: EM | Admit: 2016-12-21 | Discharge: 2016-12-21 | Disposition: A | Discharge status: Home / Self Care | Payer: Self-pay | Attending: Emergency Medicine | Admitting: Emergency Medicine

## 2016-12-21 ENCOUNTER — Encounter (HOSPITAL_COMMUNITY): Payer: Self-pay

## 2016-12-21 ENCOUNTER — Other Ambulatory Visit: Payer: Self-pay

## 2016-12-21 DIAGNOSIS — Z438 Encounter for attention to other artificial openings: Secondary | ICD-10-CM | POA: Insufficient documentation

## 2016-12-21 DIAGNOSIS — K5780 Diverticulitis of intestine, part unspecified, with perforation and abscess without bleeding: Secondary | ICD-10-CM | POA: Insufficient documentation

## 2016-12-21 DIAGNOSIS — Z87891 Personal history of nicotine dependence: Secondary | ICD-10-CM | POA: Insufficient documentation

## 2016-12-21 DIAGNOSIS — Z79899 Other long term (current) drug therapy: Secondary | ICD-10-CM | POA: Insufficient documentation

## 2016-12-21 DIAGNOSIS — Z4803 Encounter for change or removal of drains: Secondary | ICD-10-CM

## 2016-12-21 NOTE — ED Notes (Signed)
Bed: WA02 Expected date:  Expected time:  Means of arrival:  Comments: EMS-drain pulled out

## 2016-12-21 NOTE — ED Notes (Signed)
Pt is alert and oriented x 4 and is verbally responsive. Pt reports that he had a drain placed in the Rt lower back for txmt of his diverticulitis d/t bowel pero foration. Pt reports that drain tubing got caught on door handle and was pulled out. Pt has a Band aid covering site. Scant sangrias drainage is noted,
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
## 2016-12-21 NOTE — Discharge Instructions (Signed)
Follow up with your general surgeon, call to set up an appointment this week, monitor for fever, vomiting, worsening symptoms

## 2016-12-21 NOTE — ED Triage Notes (Signed)
Pt arrived vis EMS from home, Pt has hx of Diverticulitis and had a recent infection. Pt has drainage from micro tube. Per EMS some slight blood is noted.   EMS v/s HR 100, 100%, RR 18, BP 140/90

## 2016-12-24 ENCOUNTER — Other Ambulatory Visit: Payer: Self-pay

## 2017-02-24 ENCOUNTER — Emergency Department (HOSPITAL_COMMUNITY): Payer: Self-pay

## 2017-02-24 ENCOUNTER — Encounter (HOSPITAL_COMMUNITY): Payer: Self-pay | Admitting: Internal Medicine

## 2017-02-24 ENCOUNTER — Emergency Department (HOSPITAL_COMMUNITY)
Admission: EM | Admit: 2017-02-24 | Discharge: 2017-02-24 | Disposition: A | Discharge status: Home / Self Care | Payer: Self-pay | Attending: Emergency Medicine | Admitting: Emergency Medicine

## 2017-02-24 DIAGNOSIS — Z87891 Personal history of nicotine dependence: Secondary | ICD-10-CM | POA: Insufficient documentation

## 2017-02-24 DIAGNOSIS — J189 Pneumonia, unspecified organism: Secondary | ICD-10-CM | POA: Insufficient documentation

## 2017-02-24 HISTORY — DX: Diverticulitis of intestine, part unspecified, without perforation or abscess without bleeding: K57.92

## 2017-02-24 LAB — COMPREHENSIVE METABOLIC PANEL
ALBUMIN: 3.7 g/dL (ref 3.5–5.0)
ALT: 32 U/L (ref 17–63)
ANION GAP: 12 (ref 5–15)
AST: 31 U/L (ref 15–41)
Alkaline Phosphatase: 75 U/L (ref 38–126)
BILIRUBIN TOTAL: 0.5 mg/dL (ref 0.3–1.2)
BUN: 8 mg/dL (ref 6–20)
CHLORIDE: 102 mmol/L (ref 101–111)
CO2: 22 mmol/L (ref 22–32)
Calcium: 9.1 mg/dL (ref 8.9–10.3)
Creatinine, Ser: 0.77 mg/dL (ref 0.61–1.24)
GFR calc Af Amer: >60 mL/min (ref >60)
GLUCOSE: 103 mg/dL — AB (ref 65–99)
POTASSIUM: 3.6 mmol/L (ref 3.5–5.1)
Sodium: 136 mmol/L (ref 135–145)
TOTAL PROTEIN: 7.7 g/dL (ref 6.5–8.1)

## 2017-02-24 LAB — URINALYSIS, ROUTINE W REFLEX MICROSCOPIC
BILIRUBIN URINE: NEGATIVE
Glucose, UA: NEGATIVE mg/dL
HGB URINE DIPSTICK: NEGATIVE
Ketones, ur: NEGATIVE mg/dL
LEUKOCYTES UA: NEGATIVE
NITRITE: NEGATIVE
Protein, ur: >=300 mg/dL — AB
SPECIFIC GRAVITY, URINE: 1.022 (ref 1.005–1.030)
pH: 6 (ref 5.0–8.0)

## 2017-02-24 LAB — CBC WITH DIFFERENTIAL/PLATELET
BASOS ABS: 0 10*3/uL (ref 0.0–0.1)
BASOS PCT: 0 %
EOS PCT: 0 %
Eosinophils Absolute: 0 10*3/uL (ref 0.0–0.7)
HEMATOCRIT: 44.8 % (ref 39.0–52.0)
Hemoglobin: 15.7 g/dL (ref 13.0–17.0)
Lymphocytes Relative: 32 %
Lymphs Abs: 1.2 10*3/uL (ref 0.7–4.0)
MCH: 29.6 pg (ref 26.0–34.0)
MCHC: 35 g/dL (ref 30.0–36.0)
MCV: 84.4 fL (ref 78.0–100.0)
MONO ABS: 0.5 10*3/uL (ref 0.1–1.0)
MONOS PCT: 13 %
NEUTROS ABS: 2 10*3/uL (ref 1.7–7.7)
Neutrophils Relative %: 55 %
PLATELETS: 175 10*3/uL (ref 150–400)
RBC: 5.31 MIL/uL (ref 4.22–5.81)
RDW: 12.7 % (ref 11.5–15.5)
WBC: 3.6 10*3/uL — ABNORMAL LOW (ref 4.0–10.5)

## 2017-02-24 LAB — LIPASE, BLOOD: LIPASE: 27 U/L (ref 11–51)

## 2017-02-24 MED ORDER — TRAMADOL HCL 50 MG PO TABS: 50.0000 mg | ORAL_TABLET | Freq: Four times a day (QID) | ORAL | 0 refills | Status: DC | PRN

## 2017-02-24 MED ORDER — HYDROMORPHONE HCL 1 MG/ML IJ SOLN
1.0000 mg | Freq: Once | INTRAMUSCULAR | Status: AC
  Administered 2017-02-24: 1 mg via INTRAVENOUS
  Filled 2017-02-24: qty 1

## 2017-02-24 MED ORDER — ONDANSETRON HCL 4 MG/2ML IJ SOLN
4.0000 mg | Freq: Once | INTRAMUSCULAR | Status: AC
  Administered 2017-02-24: 4 mg via INTRAVENOUS
  Filled 2017-02-24: qty 2

## 2017-02-24 MED ORDER — RANITIDINE HCL 150 MG PO TABS: 150.0000 mg | ORAL_TABLET | Freq: Two times a day (BID) | ORAL | 0 refills | Status: DC

## 2017-02-24 MED ORDER — DOXYCYCLINE HYCLATE 100 MG PO CAPS: 100.0000 mg | ORAL_CAPSULE | Freq: Two times a day (BID) | ORAL | 0 refills | Status: DC

## 2017-02-24 MED ORDER — IOPAMIDOL (ISOVUE-300) INJECTION 61%
INTRAVENOUS | Status: AC
  Administered 2017-02-24: 100 mL
  Filled 2017-02-24: qty 100

## 2017-02-24 MED ORDER — SODIUM CHLORIDE 0.9 % IV BOLUS (SEPSIS)
1000.0000 mL | Freq: Once | INTRAVENOUS | Status: AC
  Administered 2017-02-24: 1000 mL via INTRAVENOUS

## 2017-02-24 NOTE — Discharge Instructions (Signed)
Follow up with Dr. Matthias HughsBuccini or 1 of his partners at [facility] in the next 1-2 weeks.  Return if any problems

## 2017-02-24 NOTE — ED Triage Notes (Signed)
Pt arrived to [facility] from home with abdominal pain and distended abdomen. Patient diagnosed with diverticulitis in September. Drain that was placed in September came out about 6 weeks ago after becoming caught on a bathroom door handle. Pt reports 10/10 abdominal pain currently.
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
## 2017-07-21 ENCOUNTER — Inpatient Hospital Stay (HOSPITAL_COMMUNITY)
Admission: EM | Admit: 2017-07-21 | Discharge: 2017-07-23 | DRG: 246 | Disposition: A | Discharge status: Home / Self Care | Payer: Self-pay | Source: Ambulatory Visit | Attending: Cardiovascular Disease | Admitting: Cardiovascular Disease

## 2017-07-21 ENCOUNTER — Inpatient Hospital Stay (HOSPITAL_COMMUNITY): Payer: Self-pay

## 2017-07-21 ENCOUNTER — Other Ambulatory Visit: Payer: Self-pay

## 2017-07-21 ENCOUNTER — Encounter (HOSPITAL_COMMUNITY)
Admission: EM | Disposition: A | Discharge status: Home / Self Care | Payer: Self-pay | Source: Ambulatory Visit | Attending: Cardiovascular Disease

## 2017-07-21 ENCOUNTER — Encounter (HOSPITAL_COMMUNITY): Payer: Self-pay | Admitting: *Deleted

## 2017-07-21 DIAGNOSIS — I4901 Ventricular fibrillation: Secondary | ICD-10-CM

## 2017-07-21 DIAGNOSIS — F1410 Cocaine abuse, uncomplicated: Secondary | ICD-10-CM | POA: Diagnosis present

## 2017-07-21 DIAGNOSIS — F1010 Alcohol abuse, uncomplicated: Secondary | ICD-10-CM | POA: Diagnosis present

## 2017-07-21 DIAGNOSIS — I2510 Atherosclerotic heart disease of native coronary artery without angina pectoris: Secondary | ICD-10-CM

## 2017-07-21 DIAGNOSIS — E785 Hyperlipidemia, unspecified: Secondary | ICD-10-CM | POA: Diagnosis present

## 2017-07-21 DIAGNOSIS — I213 ST elevation (STEMI) myocardial infarction of unspecified site: Secondary | ICD-10-CM | POA: Diagnosis present

## 2017-07-21 DIAGNOSIS — Z79899 Other long term (current) drug therapy: Secondary | ICD-10-CM

## 2017-07-21 DIAGNOSIS — I5030 Unspecified diastolic (congestive) heart failure: Secondary | ICD-10-CM

## 2017-07-21 DIAGNOSIS — I2102 ST elevation (STEMI) myocardial infarction involving left anterior descending coronary artery: Principal | ICD-10-CM | POA: Diagnosis present

## 2017-07-21 DIAGNOSIS — F17200 Nicotine dependence, unspecified, uncomplicated: Secondary | ICD-10-CM | POA: Diagnosis present

## 2017-07-21 DIAGNOSIS — I25118 Atherosclerotic heart disease of native coronary artery with other forms of angina pectoris: Secondary | ICD-10-CM | POA: Diagnosis present

## 2017-07-21 DIAGNOSIS — Z955 Presence of coronary angioplasty implant and graft: Secondary | ICD-10-CM

## 2017-07-21 HISTORY — PX: LEFT HEART CATH AND CORONARY ANGIOGRAPHY: CATH118249

## 2017-07-21 HISTORY — PX: CORONARY/GRAFT ACUTE MI REVASCULARIZATION: CATH118305

## 2017-07-21 LAB — LIPID PANEL
Cholesterol: 261 mg/dL — ABNORMAL HIGH (ref 0–200)
HDL: 44 mg/dL (ref >40)
LDL CALC: 176 mg/dL — AB (ref 0–99)
Total CHOL/HDL Ratio: 5.9 RATIO
Triglycerides: 204 mg/dL — ABNORMAL HIGH (ref <150)
VLDL: 41 mg/dL — AB (ref 0–40)

## 2017-07-21 LAB — CBC WITH DIFFERENTIAL/PLATELET
ABS IMMATURE GRANULOCYTES: 0.1 10*3/uL (ref 0.0–0.1)
Basophils Absolute: 0.1 10*3/uL (ref 0.0–0.1)
Basophils Relative: 1 %
EOS PCT: 2 %
Eosinophils Absolute: 0.2 10*3/uL (ref 0.0–0.7)
HEMATOCRIT: 46.4 % (ref 39.0–52.0)
HEMOGLOBIN: 15.9 g/dL (ref 13.0–17.0)
Immature Granulocytes: 1 %
LYMPHS ABS: 2.1 10*3/uL (ref 0.7–4.0)
LYMPHS PCT: 19 %
MCH: 29.1 pg (ref 26.0–34.0)
MCHC: 34.3 g/dL (ref 30.0–36.0)
MCV: 84.8 fL (ref 78.0–100.0)
MONOS PCT: 7 %
Monocytes Absolute: 0.7 10*3/uL (ref 0.1–1.0)
NEUTROS ABS: 7.7 10*3/uL (ref 1.7–7.7)
Neutrophils Relative %: 70 %
Platelets: 257 10*3/uL (ref 150–400)
RBC: 5.47 MIL/uL (ref 4.22–5.81)
RDW: 12.8 % (ref 11.5–15.5)
WBC: 10.9 10*3/uL — ABNORMAL HIGH (ref 4.0–10.5)

## 2017-07-21 LAB — COMPREHENSIVE METABOLIC PANEL
ALBUMIN: 4.4 g/dL (ref 3.5–5.0)
ALK PHOS: 78 U/L (ref 38–126)
ALT: 52 U/L — ABNORMAL HIGH (ref 0–44)
ANION GAP: 14 (ref 5–15)
AST: 35 U/L (ref 15–41)
BUN: 11 mg/dL (ref 6–20)
CALCIUM: 9.8 mg/dL (ref 8.9–10.3)
CO2: 16 mmol/L — AB (ref 22–32)
Chloride: 108 mmol/L (ref 98–111)
Creatinine, Ser: 1.15 mg/dL (ref 0.61–1.24)
GFR calc Af Amer: >60 mL/min (ref >60)
GFR calc non Af Amer: >60 mL/min (ref >60)
GLUCOSE: 145 mg/dL — AB (ref 70–99)
POTASSIUM: 3.9 mmol/L (ref 3.5–5.1)
SODIUM: 138 mmol/L (ref 135–145)
TOTAL PROTEIN: 7.8 g/dL (ref 6.5–8.1)
Total Bilirubin: 0.9 mg/dL (ref 0.3–1.2)

## 2017-07-21 LAB — POCT I-STAT, CHEM 8
BUN: 13 mg/dL (ref 6–20)
Calcium, Ion: 1.21 mmol/L (ref 1.15–1.40)
Chloride: 108 mmol/L (ref 98–111)
Creatinine, Ser: 0.9 mg/dL (ref 0.61–1.24)
Glucose, Bld: 146 mg/dL — ABNORMAL HIGH (ref 70–99)
HEMATOCRIT: 48 % (ref 39.0–52.0)
Hemoglobin: 16.3 g/dL (ref 13.0–17.0)
Potassium: 3.9 mmol/L (ref 3.5–5.1)
SODIUM: 142 mmol/L (ref 135–145)
TCO2: 17 mmol/L — AB (ref 22–32)

## 2017-07-21 LAB — ECHOCARDIOGRAM COMPLETE
Height: 72 in
Weight: 3520 oz

## 2017-07-21 LAB — TROPONIN I
TROPONIN I: 9.32 ng/mL — AB (ref <0.03)
Troponin I: <0.03 ng/mL (ref <0.03)

## 2017-07-21 LAB — POCT ACTIVATED CLOTTING TIME: ACTIVATED CLOTTING TIME: 290 s

## 2017-07-21 LAB — PROTIME-INR
INR: 0.97
PROTHROMBIN TIME: 12.8 s (ref 11.4–15.2)

## 2017-07-21 LAB — BRAIN NATRIURETIC PEPTIDE: B NATRIURETIC PEPTIDE 5: 11.8 pg/mL (ref 0.0–100.0)

## 2017-07-21 LAB — APTT: aPTT: 26 seconds (ref 24–36)

## 2017-07-21 LAB — MRSA PCR SCREENING: MRSA BY PCR: POSITIVE — AB

## 2017-07-21 LAB — HEMOGLOBIN A1C
HEMOGLOBIN A1C: 6 % — AB (ref 4.8–5.6)
Mean Plasma Glucose: 125.5 mg/dL

## 2017-07-21 SURGERY — LEFT HEART CATH AND CORONARY ANGIOGRAPHY
Anesthesia: LOCAL

## 2017-07-21 MED ORDER — LIDOCAINE HCL (CARDIAC) PF 100 MG/5ML IV SOSY
PREFILLED_SYRINGE | INTRAVENOUS | Status: DC | PRN
  Administered 2017-07-21: 100 mg via INTRAVENOUS

## 2017-07-21 MED ORDER — SODIUM CHLORIDE 0.9 % IV SOLN
INTRAVENOUS | Status: AC | PRN
  Administered 2017-07-21: 20 mL/h via INTRAVENOUS

## 2017-07-21 MED ORDER — ATORVASTATIN CALCIUM 80 MG PO TABS
80.0000 mg | ORAL_TABLET | Freq: Every day | ORAL | Status: DC
  Administered 2017-07-21 – 2017-07-22 (×2): 80 mg via ORAL
  Filled 2017-07-21 (×2): qty 1

## 2017-07-21 MED ORDER — CHLORHEXIDINE GLUCONATE CLOTH 2 % EX PADS: 6.0000 | MEDICATED_PAD | Freq: Every day | CUTANEOUS | Status: DC

## 2017-07-21 MED ORDER — LABETALOL HCL 5 MG/ML IV SOLN: 10.0000 mg | INTRAVENOUS | Status: AC | PRN

## 2017-07-21 MED ORDER — HEPARIN (PORCINE) IN NACL 2-0.9 UNITS/ML
INTRAMUSCULAR | Status: DC | PRN
  Administered 2017-07-21: 14:00:00 via INTRA_ARTERIAL

## 2017-07-21 MED ORDER — NITROGLYCERIN 1 MG/10 ML FOR IR/CATH LAB
INTRA_ARTERIAL | Status: AC
  Filled 2017-07-21: qty 10

## 2017-07-21 MED ORDER — TICAGRELOR 90 MG PO TABS
90.0000 mg | ORAL_TABLET | Freq: Two times a day (BID) | ORAL | Status: DC
  Administered 2017-07-21 – 2017-07-23 (×4): 90 mg via ORAL
  Filled 2017-07-21 (×4): qty 1

## 2017-07-21 MED ORDER — NITROGLYCERIN 0.4 MG SL SUBL
0.4000 mg | SUBLINGUAL_TABLET | SUBLINGUAL | Status: DC | PRN
  Administered 2017-07-21 – 2017-07-22 (×2): 0.4 mg via SUBLINGUAL
  Filled 2017-07-21 (×2): qty 1

## 2017-07-21 MED ORDER — LIDOCAINE HCL (PF) 1 % IJ SOLN
INTRAMUSCULAR | Status: AC
  Filled 2017-07-21: qty 30

## 2017-07-21 MED ORDER — WHITE PETROLATUM EX OINT
TOPICAL_OINTMENT | CUTANEOUS | Status: AC
  Administered 2017-07-21: 0.2
  Filled 2017-07-21: qty 28.35

## 2017-07-21 MED ORDER — FENTANYL CITRATE (PF) 100 MCG/2ML IJ SOLN
INTRAMUSCULAR | Status: DC | PRN
  Administered 2017-07-21: 25 ug via INTRAVENOUS
  Administered 2017-07-21: 50 ug via INTRAVENOUS

## 2017-07-21 MED ORDER — NITROGLYCERIN 1 MG/10 ML FOR IR/CATH LAB
INTRA_ARTERIAL | Status: DC | PRN
  Administered 2017-07-21: 150 ug via INTRACORONARY

## 2017-07-21 MED ORDER — ASPIRIN 81 MG PO CHEW
81.0000 mg | CHEWABLE_TABLET | Freq: Every day | ORAL | Status: DC
  Administered 2017-07-22 – 2017-07-23 (×2): 81 mg via ORAL
  Filled 2017-07-21 (×2): qty 1

## 2017-07-21 MED ORDER — SODIUM CHLORIDE 0.9 % IV SOLN: 250.0000 mL | INTRAVENOUS | Status: DC | PRN

## 2017-07-21 MED ORDER — ADULT MULTIVITAMIN W/MINERALS CH
1.0000 | ORAL_TABLET | Freq: Every day | ORAL | Status: DC
  Administered 2017-07-22 – 2017-07-23 (×2): 1 via ORAL
  Filled 2017-07-21 (×2): qty 1

## 2017-07-21 MED ORDER — SODIUM CHLORIDE 0.9 % WEIGHT BASED INFUSION
1.0000 mL/kg/h | INTRAVENOUS | Status: AC
  Administered 2017-07-21: 1 mL/kg/h via INTRAVENOUS

## 2017-07-21 MED ORDER — IOPAMIDOL (ISOVUE-370) INJECTION 76%
INTRAVENOUS | Status: DC | PRN
  Administered 2017-07-21: 125 mL via INTRA_ARTERIAL

## 2017-07-21 MED ORDER — LORAZEPAM 1 MG PO TABS: 0.0000 mg | ORAL_TABLET | Freq: Two times a day (BID) | ORAL | Status: DC

## 2017-07-21 MED ORDER — LIDOCAINE HCL (CARDIAC) PF 100 MG/5ML IV SOSY
PREFILLED_SYRINGE | INTRAVENOUS | Status: AC
  Filled 2017-07-21: qty 5

## 2017-07-21 MED ORDER — MORPHINE SULFATE (PF) 2 MG/ML IV SOLN
2.0000 mg | INTRAVENOUS | Status: DC | PRN
Start: 2017-07-21 — End: 2017-07-23
  Administered 2017-07-21: 2 mg via INTRAVENOUS
  Filled 2017-07-21: qty 1

## 2017-07-21 MED ORDER — LIDOCAINE HCL (PF) 1 % IJ SOLN
INTRAMUSCULAR | Status: DC | PRN
  Administered 2017-07-21: 2 mL

## 2017-07-21 MED ORDER — MIDAZOLAM HCL 2 MG/2ML IJ SOLN
INTRAMUSCULAR | Status: AC
  Filled 2017-07-21: qty 2

## 2017-07-21 MED ORDER — HEPARIN (PORCINE) IN NACL 1000-0.9 UT/500ML-% IV SOLN
INTRAVENOUS | Status: AC
  Filled 2017-07-21: qty 1000

## 2017-07-21 MED ORDER — HEPARIN (PORCINE) IN NACL 1000-0.9 UT/500ML-% IV SOLN
INTRAVENOUS | Status: DC | PRN
  Administered 2017-07-21: 500 mL

## 2017-07-21 MED ORDER — TIROFIBAN (AGGRASTAT) BOLUS VIA INFUSION
INTRAVENOUS | Status: DC | PRN
  Administered 2017-07-21: 2475 ug via INTRAVENOUS

## 2017-07-21 MED ORDER — HYDRALAZINE HCL 20 MG/ML IJ SOLN: 5.0000 mg | INTRAMUSCULAR | Status: AC | PRN

## 2017-07-21 MED ORDER — FOLIC ACID 1 MG PO TABS
1.0000 mg | ORAL_TABLET | Freq: Every day | ORAL | Status: DC
  Administered 2017-07-22 – 2017-07-23 (×2): 1 mg via ORAL
  Filled 2017-07-21 (×2): qty 1

## 2017-07-21 MED ORDER — TIROFIBAN HCL IN NACL 5-0.9 MG/100ML-% IV SOLN
0.1500 ug/kg/min | INTRAVENOUS | Status: DC
  Filled 2017-07-21 (×2): qty 100

## 2017-07-21 MED ORDER — ONDANSETRON HCL 4 MG/2ML IJ SOLN: 4.0000 mg | Freq: Four times a day (QID) | INTRAMUSCULAR | Status: DC | PRN

## 2017-07-21 MED ORDER — VITAMIN B-1 100 MG PO TABS
100.0000 mg | ORAL_TABLET | Freq: Every day | ORAL | Status: DC
  Administered 2017-07-22 – 2017-07-23 (×2): 100 mg via ORAL
  Filled 2017-07-21 (×2): qty 1

## 2017-07-21 MED ORDER — VERAPAMIL HCL 2.5 MG/ML IV SOLN
INTRAVENOUS | Status: AC
  Filled 2017-07-21: qty 2

## 2017-07-21 MED ORDER — MIDAZOLAM HCL 2 MG/2ML IJ SOLN
INTRAMUSCULAR | Status: DC | PRN
  Administered 2017-07-21: 1 mg via INTRAVENOUS
  Administered 2017-07-21: 2 mg via INTRAVENOUS

## 2017-07-21 MED ORDER — LORAZEPAM 2 MG/ML IJ SOLN: 1.0000 mg | Freq: Four times a day (QID) | INTRAMUSCULAR | Status: DC | PRN

## 2017-07-21 MED ORDER — HEPARIN SODIUM (PORCINE) 5000 UNIT/ML IJ SOLN
5000.0000 [IU] | Freq: Three times a day (TID) | INTRAMUSCULAR | Status: DC
  Administered 2017-07-22 – 2017-07-23 (×3): 5000 [IU] via SUBCUTANEOUS
  Filled 2017-07-21 (×3): qty 1

## 2017-07-21 MED ORDER — METOPROLOL TARTRATE 25 MG PO TABS
25.0000 mg | ORAL_TABLET | Freq: Two times a day (BID) | ORAL | Status: DC
  Administered 2017-07-21 – 2017-07-22 (×2): 25 mg via ORAL
  Filled 2017-07-21 (×3): qty 1

## 2017-07-21 MED ORDER — HEPARIN SODIUM (PORCINE) 1000 UNIT/ML IJ SOLN
INTRAMUSCULAR | Status: AC
  Filled 2017-07-21: qty 1

## 2017-07-21 MED ORDER — SODIUM CHLORIDE 0.9% FLUSH
3.0000 mL | Freq: Two times a day (BID) | INTRAVENOUS | Status: DC
  Administered 2017-07-22 – 2017-07-23 (×3): 3 mL via INTRAVENOUS

## 2017-07-21 MED ORDER — MUPIROCIN 2 % EX OINT
1.0000 "application " | TOPICAL_OINTMENT | Freq: Two times a day (BID) | CUTANEOUS | Status: DC
  Administered 2017-07-21 – 2017-07-23 (×4): 1 via NASAL
  Filled 2017-07-21 (×3): qty 22

## 2017-07-21 MED ORDER — LORAZEPAM 1 MG PO TABS: 1.0000 mg | ORAL_TABLET | Freq: Four times a day (QID) | ORAL | Status: DC | PRN

## 2017-07-21 MED ORDER — SODIUM CHLORIDE 0.9% FLUSH
3.0000 mL | INTRAVENOUS | Status: DC | PRN
Start: 2017-07-22 — End: 2017-07-23

## 2017-07-21 MED ORDER — HEPARIN SODIUM (PORCINE) 1000 UNIT/ML IJ SOLN
INTRAMUSCULAR | Status: DC | PRN
  Administered 2017-07-21: 10000 [IU] via INTRAVENOUS

## 2017-07-21 MED ORDER — TIROFIBAN HCL IV 12.5 MG/250 ML: 0.1500 ug/kg/min | INTRAVENOUS | Status: AC

## 2017-07-21 MED ORDER — ACETAMINOPHEN 325 MG PO TABS
650.0000 mg | ORAL_TABLET | ORAL | Status: DC | PRN
  Administered 2017-07-22 (×4): 650 mg via ORAL
  Filled 2017-07-21 (×5): qty 2

## 2017-07-21 MED ORDER — OXYCODONE HCL 5 MG PO TABS
5.0000 mg | ORAL_TABLET | ORAL | Status: DC | PRN
  Administered 2017-07-22 (×4): 5 mg via ORAL
  Filled 2017-07-21 (×4): qty 1

## 2017-07-21 MED ORDER — FENTANYL CITRATE (PF) 100 MCG/2ML IJ SOLN
INTRAMUSCULAR | Status: AC
  Filled 2017-07-21: qty 2

## 2017-07-21 MED ORDER — TIROFIBAN HCL IV 12.5 MG/250 ML
INTRAVENOUS | Status: AC | PRN
  Administered 2017-07-21: 0.15 ug/kg/min via INTRAVENOUS

## 2017-07-21 MED ORDER — TIROFIBAN HCL IV 12.5 MG/250 ML
INTRAVENOUS | Status: AC
  Filled 2017-07-21: qty 250

## 2017-07-21 MED ORDER — TICAGRELOR 90 MG PO TABS
ORAL_TABLET | ORAL | Status: AC
  Filled 2017-07-21: qty 2

## 2017-07-21 MED ORDER — LORAZEPAM 1 MG PO TABS: 0.0000 mg | ORAL_TABLET | Freq: Four times a day (QID) | ORAL | Status: DC

## 2017-07-21 MED ORDER — THIAMINE HCL 100 MG/ML IJ SOLN: 100.0000 mg | Freq: Every day | INTRAMUSCULAR | Status: DC

## 2017-07-21 MED ORDER — TICAGRELOR 90 MG PO TABS
ORAL_TABLET | ORAL | Status: DC | PRN
  Administered 2017-07-21: 180 mg via ORAL

## 2017-07-21 SURGICAL SUPPLY — 19 items
BALLN EMERGE MR 2.5X15 (BALLOONS) ×2
BALLN ~~LOC~~ EMERGE MR 4.5X12 (BALLOONS) ×2
BALLOON EMERGE MR 2.5X15 (BALLOONS) IMPLANT
BALLOON ~~LOC~~ EMERGE MR 4.5X12 (BALLOONS) IMPLANT
CATH INFINITI 5FR ANG PIGTAIL (CATHETERS) ×1 IMPLANT
CATH INFINITI JR4 5F (CATHETERS) ×2 IMPLANT
CATH LAUNCHER 6FR EBU3.5 (CATHETERS) ×1 IMPLANT
DEVICE RAD COMP TR BAND LRG (VASCULAR PRODUCTS) ×1 IMPLANT
GLIDESHEATH SLEND SS 6F .021 (SHEATH) ×1 IMPLANT
GUIDEWIRE INQWIRE 1.5J.035X260 (WIRE) IMPLANT
INQWIRE 1.5J .035X260CM (WIRE) ×2
KIT ENCORE 26 ADVANTAGE (KITS) ×1 IMPLANT
KIT HEART LEFT (KITS) ×2 IMPLANT
PACK CARDIAC CATHETERIZATION (CUSTOM PROCEDURE TRAY) ×2 IMPLANT
STENT SYNERGY DES 4X38 (Permanent Stent) ×1 IMPLANT
SYR MEDRAD MARK V 150ML (SYRINGE) ×2 IMPLANT
TRANSDUCER W/STOPCOCK (MISCELLANEOUS) ×2 IMPLANT
TUBING CIL FLEX 10 FLL-RA (TUBING) ×2 IMPLANT
WIRE COUGAR XT STRL 190CM (WIRE) ×1 IMPLANT

## 2017-07-21 NOTE — Progress Notes (Signed)
  Echocardiogram 2D Echocardiogram has been performed.  Delcie RochENNINGTON, Annitta Fifield 07/21/2017, 5:18 PM
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
## 2017-07-22 ENCOUNTER — Encounter (HOSPITAL_COMMUNITY): Payer: Self-pay | Admitting: Cardiovascular Disease

## 2017-07-22 LAB — LIPID PANEL
CHOL/HDL RATIO: 5.4 ratio
CHOLESTEROL: 223 mg/dL — AB (ref 0–200)
HDL: 41 mg/dL (ref >40)
LDL Cholesterol: 130 mg/dL — ABNORMAL HIGH (ref 0–99)
Triglycerides: 260 mg/dL — ABNORMAL HIGH (ref <150)
VLDL: 52 mg/dL — AB (ref 0–40)

## 2017-07-22 LAB — BASIC METABOLIC PANEL
Anion gap: 10 (ref 5–15)
BUN: 9 mg/dL (ref 6–20)
CALCIUM: 9 mg/dL (ref 8.9–10.3)
CO2: 24 mmol/L (ref 22–32)
CREATININE: 0.98 mg/dL (ref 0.61–1.24)
Chloride: 102 mmol/L (ref 98–111)
GFR calc non Af Amer: >60 mL/min (ref >60)
GLUCOSE: 122 mg/dL — AB (ref 70–99)
Potassium: 3.9 mmol/L (ref 3.5–5.1)
Sodium: 136 mmol/L (ref 135–145)

## 2017-07-22 LAB — TROPONIN I: Troponin I: 10.95 ng/mL (ref <0.03)

## 2017-07-22 LAB — CBC
HCT: 43.8 % (ref 39.0–52.0)
Hemoglobin: 14.4 g/dL (ref 13.0–17.0)
MCH: 29.2 pg (ref 26.0–34.0)
MCHC: 32.9 g/dL (ref 30.0–36.0)
MCV: 88.8 fL (ref 78.0–100.0)
PLATELETS: 239 10*3/uL (ref 150–400)
RBC: 4.93 MIL/uL (ref 4.22–5.81)
RDW: 13.2 % (ref 11.5–15.5)
WBC: 12.1 10*3/uL — ABNORMAL HIGH (ref 4.0–10.5)

## 2017-07-22 MED ORDER — LOSARTAN POTASSIUM 25 MG PO TABS
25.0000 mg | ORAL_TABLET | Freq: Every day | ORAL | Status: DC
  Administered 2017-07-22 – 2017-07-23 (×2): 25 mg via ORAL
  Filled 2017-07-22 (×2): qty 1

## 2017-07-22 MED ORDER — CARVEDILOL 3.125 MG PO TABS
3.1250 mg | ORAL_TABLET | Freq: Two times a day (BID) | ORAL | Status: DC
  Administered 2017-07-22 – 2017-07-23 (×2): 3.125 mg via ORAL
  Filled 2017-07-22 (×2): qty 1

## 2017-07-22 NOTE — Progress Notes (Signed)
Critical lab received troponin 9.32 Physician not notified d/t it being an expected for a STEMI patient s/p left heart catheterization with intervention.

## 2017-07-22 NOTE — Progress Notes (Signed)
Progress Note  Patient Name: Derrick Villa Date of Encounter: 07/22/2017  Primary Cardiologist: No primary care provider on file.   Subjective   Feels well.  Had mild chest pain this morning.  No shortness of breath or other complaints.  Currently pain-free.  Inpatient Medications    Scheduled Meds: . aspirin  81 mg Oral Daily  . atorvastatin  80 mg Oral q1800  . Chlorhexidine Gluconate Cloth  6 each Topical Q0600  . folic acid  1 mg Oral Daily  . heparin  5,000 Units Subcutaneous Q8H  . LORazepam  0-4 mg Oral Q6H   Followed by  . [START ON 07/23/2017] LORazepam  0-4 mg Oral Q12H  . metoprolol tartrate  25 mg Oral BID  . multivitamin with minerals  1 tablet Oral Daily  . mupirocin ointment  1 application Nasal BID  . sodium chloride flush  3 mL Intravenous Q12H  . thiamine  100 mg Oral Daily   Or  . thiamine  100 mg Intravenous Daily  . ticagrelor  90 mg Oral BID   Continuous Infusions: . sodium chloride     PRN Meds: sodium chloride, acetaminophen, LORazepam **OR** LORazepam, morphine injection, nitroGLYCERIN, ondansetron (ZOFRAN) IV, oxyCODONE, sodium chloride flush   Vital Signs    Vitals:   07/22/17 0200 07/22/17 0300 07/22/17 0500 07/22/17 0600  BP: (!) 148/86 (!) 150/91 130/83 (!) 142/94  Pulse: 79 82 72 71  Resp: 20 17 15 16   Temp:      TempSrc:      SpO2: 98% 98% 98% 99%  Weight:      Height:        Intake/Output Summary (Last 24 hours) at 07/22/2017 0636 Last data filed at 07/22/2017 0400 Gross per 24 hour  Intake 927.15 ml  Output 800 ml  Net 127.15 ml   Filed Weights   07/21/17 1507 07/21/17 1530  Weight: 220 lb 0.3 oz (99.8 kg) 220 lb (99.8 kg)    Telemetry    Normal sinus rhythm without arrhythmia- Personally Reviewed  ECG    Normal sinus rhythm with age-indeterminate anterolateral MI- Personally Reviewed  Physical Exam  Alert, oriented male in no distress GEN: No acute distress.   Neck: No JVD Cardiac: RRR, no murmurs, rubs, or  gallops.  Respiratory: Clear to auscultation bilaterally. GI: Soft, nontender, non-distended  MS: No edema; No deformity.  Right radial site clear Neuro:  Nonfocal  Psych: Normal affect   Labs    Chemistry Recent Labs  Lab 07/21/17 1424 07/21/17 1427 07/22/17 0316  NA 142 138 136  K 3.9 3.9 3.9  CL 108 108 102  CO2  --  16* 24  GLUCOSE 146* 145* 122*  BUN 13 11 9   CREATININE 0.90 1.15 0.98  CALCIUM  --  9.8 9.0  PROT  --  7.8  --   ALBUMIN  --  4.4  --   AST  --  35  --   ALT  --  52*  --   ALKPHOS  --  78  --   BILITOT  --  0.9  --   GFRNONAA  --  >60 >60  GFRAA  --  >60 >60  ANIONGAP  --  14 10     Hematology Recent Labs  Lab 07/21/17 1424 07/21/17 1427 07/22/17 0316  WBC  --  10.9* 12.1*  RBC  --  5.47 4.93  HGB 16.3 15.9 14.4  HCT 48.0 46.4 43.8  MCV  --  84.8 88.8  MCH  --  29.1 29.2  MCHC  --  34.3 32.9  RDW  --  12.8 13.2  PLT  --  257 239    Cardiac Enzymes Recent Labs  Lab 07/21/17 1427 07/21/17 2100 07/22/17 0316  TROPONINI <0.03 9.32* 10.95*   No results for input(s): TROPIPOC in the last 168 hours.   BNP Recent Labs  Lab 07/21/17 1423  BNP 11.8     DDimer No results for input(s): DDIMER in the last 168 hours.   Radiology    No results found.  Cardiac Studies   Echo 07-21-2017: Study Conclusions  - Left ventricle: The cavity size was normal. Wall thickness was   increased in a pattern of mild LVH. Systolic function was mildly   to moderately reduced. The estimated ejection fraction was in the   range of 40% to 45%. There is hypokinesis of the   mid-apicalanteroseptal and apical myocardium. Doppler parameters   are consistent with abnormal left ventricular relaxation (grade 1   diastolic dysfunction).  Impressions:  - Hypokinesis of the distal septum and apex; overall mild to   moderate LV dysfunction (EF 40); mild diastolic dysfunction; mild   LVH.  Cath: Conclusion     There is severe left ventricular  systolic dysfunction.  LV end diastolic pressure is mildly elevated.  The left ventricular ejection fraction is 25-35% by visual estimate.  Prox LAD-1 lesion is 50% stenosed.  Prox LAD-2 lesion is 100% stenosed.  A drug-eluting stent was successfully placed using a STENT SYNERGY DES 4X38.  Post intervention, there is a 0% residual stenosis.  Post intervention, there is a 0% residual stenosis.  Mid RCA lesion is 30% stenosed.  Prox Cx to Mid Cx lesion is 30% stenosed.   1.  Acute anterior STEMI secondary to total occlusion of the LAD, treated successfully with primary PCI using a 4.0 x 38 mm Synergy DES 2. Patent left main, left circumflex, and RCA with mild nonobstructive disease 3.  Severe segmental LV systolic dysfunction consistent with a large anterior MI 4.  Ventricular fibrillation post reperfusion requiring defibrillation x2  Recommend uninterrupted dual antiplatelet therapy with Aspirin 81mg  daily and Ticagrelor 90mg  twice daily for a minimum of 12 months (ACS - Class I recommendation).    Patient Profile     52 y.o. male with hx of polysubstance abuse presents with anterior STEMI, treated with Primary PCI using a DES in the LAD  Assessment & Plan    1. Anterior STEMI: treated with stenting of the LAD. Rapid reperfusion with symptom onset only 1 hour prior to arrival. LVEF 40-45% by echo, troponin peak 10.95. Continue ASA and ticagrelor. High-intensity statin - atorvastatin 80 mg.  Change metoprolol to carvedilol.  Add low-dose losartan.  2. Hyperlipidemia: LDL 130. High intensity statin initiated  3. Tobacco abuse: smoking cessation counseling done  4. Alcohol/cocaine abuse: counseling done.  He understands that any repeated use of cocaine can potentially be life-threatening in the setting of his recent myocardial infarction and beta-blocker therapy.  The patient is counseled as such.  He has not used in 2 months and feels he can quit completely.  For questions or  updates, please contact [facility] Please consult [email or web address] for contact info under Cardiology/STEMI.      Signed, Tonny BollmanMichael Lorey Pallett, MD  07/22/2017, 6:36 AM

## 2017-07-22 NOTE — Care Management Note (Signed)
Case Management Note  Patient Details  Name: Derrick Villa MRN: [id number] Date of Birth: 03/06/1965  Subjective/Objective:     Admitted with an anterior STEMI , history of alcohol, cocaine, and tobacco use. From home alone. Pt states jobless, no health insurance. Indepent with ADL's , no DME usage PTA.     PCP: n/a  Action/Plan: Pt probably will need Match Letter to assist with medication cost @ d/c. Brilinta 30 day free card will be given and explained to pt prior d/c, along with  pt  ASSISTANCE APPLICATION FORM ([place]).  [facility]. Pt states has transportation to home.  Expected Discharge Date:                  Expected Discharge Plan:  Home/Self Care(Lives alone)  In-House Referral:     Discharge planning Services  CM Consult, [facility]igent [facility], Follow-up appt scheduled  Post Acute Care Choice:    Choice offered to:     DME Arranged:    DME Agency:     HH Arranged:    HH Agency:     Status of Service:  In process, will continue to follow  If discussed at Long Length of Stay Meetings, dates discussed:    Additional Comments:  Epifanio LeschesCole, Aundrey Elahi Hudson, RN 07/22/2017, 4:00 PM

## 2017-07-22 NOTE — Clinical Social Work Note (Signed)
Clinical Social Work Assessment  Patient Details  Name: Derrick Villa MRN: [id number] Date of Birth: [birth year]  Date of referral:  07/22/17               Reason for consult:  Legal Concerns                Permission sought to share information with:    Permission granted to share information::     Name::        Agency::     Relationship::     Contact Information:     Housing/Transportation Living arrangements for the past 2 months:  Single Family Home Source of Information:  Patient Patient Interpreter Needed:  None Criminal Activity/Legal Involvement Pertinent to Current Situation/Hospitalization:  No - Comment as needed Significant Relationships:  Parents Lives with:  Self Do you feel safe going back to the place where you live?  Yes Need for family participation in patient care:  No (Coment)  Care giving concerns: Patient from home. Patient required to report to [facility] jail on weekends. Patient works during the week. Patient requesting documentation to provide parole officer regarding hospital admission.  Social Worker assessment / plan: CSW met with patient at bedside. Patient alert and oriented. Patient requested help with documentation about admission. Patient indicated he requires a letter from MD listing dates of admission and instructions to not return to work and defer remaining days in jail to a later time due to medical condition. Patient to provide this documentation to parole officer. CSW paged MD. MD aware. CSW signing off, as no additional needs identified at this time.  Employment status:  [employer] information:  Self Pay (Medicaid Pending) PT Recommendations:  Not assessed at this time Information / Referral to community resources:     Patient/Family's Response to care: Patient appreciative of care.  Patient/Family's Understanding of and Emotional Response to Diagnosis, Current Treatment, and Prognosis: Patient with understanding of  condition.  Emotional Assessment Appearance:  Appears stated age Attitude/Demeanor/Rapport:  Engaged Affect (typically observed):  Accepting, Calm, Appropriate, Pleasant Orientation:  Oriented to Self, Oriented to Place, Oriented to  Time, Oriented to Situation Alcohol / Substance use:    Psych involvement (Current and /or in the community):  No (Comment)  Discharge Needs  Concerns to be addressed:  Legal Concerns Readmission within the last 30 days:  No Current discharge risk:  Physical Impairment, Legal Concerns Barriers to Discharge:  Continued Medical Work up   Estanislado Emms, LCSW 07/22/2017, 4:59 PM

## 2017-07-22 NOTE — Progress Notes (Signed)
CARDIAC REHAB PHASE I   PRE:  Rate/Rhythm: 96 SR    BP: sitting 149/99    SaO2:   MODE:  Ambulation: 470 ft   POST:  Rate/Rhythm: 103 ST    BP: sitting 155/105, other arm (right) 144/90     SaO2:   Pt just out of shower. Tolerated well except BP elevated. He sts he normally has high BP when talking then retake is better. Pt is quite hyper in general. Sts this is his norm. Long education on MI, stent, Brilinta, smoking cessation, pre-DM, ex, NTG, and CRPII. Will send referral to [facility] CRPII however pt sts he probably won't do program. He likes to work. Concerned for finances. He is planning to quit smoking. Needs CM for Brilinta assist.  [id number]  Harriet MassonRandi Kristan Lataysha Vohra CES, ACSM 07/22/2017 2:43 PM

## 2017-07-23 MED ORDER — NITROGLYCERIN 0.4 MG SL SUBL: 0.4000 mg | SUBLINGUAL_TABLET | SUBLINGUAL | 2 refills | Status: DC | PRN

## 2017-07-23 MED ORDER — LOSARTAN POTASSIUM 25 MG PO TABS: 25.0000 mg | ORAL_TABLET | Freq: Every day | ORAL | 5 refills | Status: DC

## 2017-07-23 MED ORDER — TICAGRELOR 90 MG PO TABS: 90.0000 mg | ORAL_TABLET | Freq: Two times a day (BID) | ORAL | 0 refills | Status: DC

## 2017-07-23 MED ORDER — ATORVASTATIN CALCIUM 80 MG PO TABS: 80.0000 mg | ORAL_TABLET | Freq: Every day | ORAL | 5 refills | Status: DC

## 2017-07-23 MED ORDER — TICAGRELOR 90 MG PO TABS: 90.0000 mg | ORAL_TABLET | Freq: Two times a day (BID) | ORAL | 3 refills | Status: DC

## 2017-07-23 MED ORDER — ASPIRIN 81 MG PO CHEW: 81.0000 mg | CHEWABLE_TABLET | Freq: Every day | ORAL | 11 refills | Status: DC

## 2017-07-23 MED ORDER — CARVEDILOL 3.125 MG PO TABS: 3.1250 mg | ORAL_TABLET | Freq: Two times a day (BID) | ORAL | 5 refills | Status: DC

## 2017-07-23 NOTE — Plan of Care (Signed)
  Problem: Education: Goal: Understanding of CV disease, CV risk reduction, and recovery process will improve Outcome: Adequate for Discharge   Problem: Activity: Goal: Ability to return to baseline activity level will improve Outcome: Adequate for Discharge   Problem: Cardiovascular: Goal: Ability to achieve and maintain adequate cardiovascular perfusion will improve Outcome: Adequate for Discharge Goal: Vascular access site(s) Level 0-1 will be maintained Outcome: Adequate for Discharge   Problem: Education: Goal: Knowledge of General Education information will improve Outcome: Adequate for Discharge   Problem: Clinical Measurements: Goal: Will remain free from infection Outcome: Adequate for Discharge   Problem: Nutrition: Goal: Adequate nutrition will be maintained Outcome: Adequate for Discharge   Problem: Elimination: Goal: Will not experience complications related to bowel motility Outcome: Adequate for Discharge Goal: Will not experience complications related to urinary retention Outcome: Adequate for Discharge   Problem: Pain Managment: Goal: General experience of comfort will improve Outcome: Adequate for Discharge   Problem: Safety: Goal: Ability to remain free from injury will improve Outcome: Adequate for Discharge

## 2017-07-23 NOTE — Discharge Summary (Signed)
Discharge Summary    Patient ID: Derrick Villa,  MRN: [id number], DOB/AGE: 52/07/1965 52 y.o.  Admit date: 07/21/2017 Discharge date: 07/23/2017  Primary Care Provider: Patient, No Pcp Per Primary Cardiologist: New (Dr. Excell Seltzerooper)  Discharge Diagnoses    Active Problems:   STEMI involving left anterior descending coronary artery [facility](HCC)   Allergies No Known Allergies  Diagnostic Studies/Procedures    Echo 07-21-2017: Study Conclusions  - Left ventricle: The cavity size was normal. Wall thickness was increased in a pattern of mild LVH. Systolic function was mildly to moderately reduced. The estimated ejection fraction was in the range of 40% to 45%. There is hypokinesis of the mid-apicalanteroseptal and apical myocardium. Doppler parameters are consistent with abnormal left ventricular relaxation (grade 1 diastolic dysfunction).  Impressions:  - Hypokinesis of the distal septum and apex; overall mild to moderate LV dysfunction (EF 40); mild diastolic dysfunction; mild LVH.  Cath 07/21/17: Conclusion     There is severe left ventricular systolic dysfunction.  LV end diastolic pressure is mildly elevated.  The left ventricular ejection fraction is 25-35% by visual estimate.  Prox LAD-1 lesion is 50% stenosed.  Prox LAD-2 lesion is 100% stenosed.  A drug-eluting stent was successfully placed using a STENT SYNERGY DES 4X38.  Post intervention, there is a 0% residual stenosis.  Post intervention, there is a 0% residual stenosis.  Mid RCA lesion is 30% stenosed.  Prox Cx to Mid Cx lesion is 30% stenosed.  1. Acute anterior STEMI secondary to total occlusion of the LAD, treated successfully with primary PCI using a 4.0 x 38 mm Synergy DES 2. Patent left main, left circumflex, and RCA with mild nonobstructive disease 3. Severe segmental LV systolic dysfunction consistent with a large anterior MI 4. Ventricular fibrillation post  reperfusion requiring defibrillation x2  Recommend uninterrupted dual antiplatelet therapy with Aspirin 81mg  daily and Ticagrelor 90mg  twice dailyfor a minimum of 12 months (ACS - Class I recommendation).     History of Present Illness     Derrick Villa is a 52 y.o. male with a history of alcohol, cocaine, and tobacco use, who [facility] with severe substernal chest pain and diagnosed with an anterior STEMI by EMS.  Mr. 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 EMS was called and on their arrival his EKG demonstrates an anterior STEMI pattern.  A code STEMI is called and he has brought directly to the cardiac catheterization lab.  The patient complains of severe substernal chest pain x1 hour.  He had no prodromal symptoms.  There is associated nausea and diaphoresis as well as shortness of breath.  No lightheadedness, syncope, or heart palpitations.  No past history of similar symptoms.  He has no personal history of heart disease.  He is had no hospitalizations or surgeries.  He denies any cocaine use in the last 2 weeks.   Hospital Course     [facility] arrival. Cardiac cath showed total occlusion of the proximal LAD, successfully treated with primary PCI using a 4.0 x 38 mm Synergy DES.  Patent left main, left circumflex, and RCA with mild nonobstructive disease. LV gram showed severe segmental LV systolic dysfunction c/w large anterior MI. During case, he had VF post reperfusion requiring defibrillation x 2. He left the cath lab in stable condition and was transferred to the CCU. 2D Echo was obtained and confirmed  systolic dysfunction w/ EF of 16-10% and hypokinesis of the mid-apicalanteroseptal and apical myocardium. He was placed on DAPT w/ ASA + Brilinta, to be continued x 12 months. He was also placed on high intensity statin w/ Lipitor 80 mg nightly, as LDL was elevated at 130 mg/dL.  Goal LDL is < 70 mg/dL. He was also placed on a BB and ARB. He had no recurrent CP and no recurrent VT. Vital signs remained stable post cath. There were no post cath complications. He was transferred to telemetry. He received counseling on importance of abstinence from substance use, including cocaine, ETOH and tobacco. He understands that any repeated use of cocaine can potentially be life-threatening in the setting of his recent myocardial infarction and beta-blocker therapy. The patient is counseled as such. Pt noted he has not used in 2 months and feels he can quit completely.  On 07/23/17, pt was seen and examined by Dr. Excell Seltzer, who determined he was stable for discharge home. He was given note to be cleared from assigned duties x 2 weeks. He can return to regular physical activity in 2 weeks but return to work date will be determined at a later time, after he is assessed by provider post hospital. Per Dr. Excell Seltzer, He should be out of work for a total of 4-6 weeks given his anterior MI. Post hospital f/u will be arranged.   Consultants: none     Discharge Vitals Blood pressure 132/82, pulse 78, temperature 98.4 F (36.9 C), temperature source Oral, resp. rate 16, height 6' (1.829 m), weight 116 lb 11.2 oz (52.9 kg), SpO2 98 %.  Filed Weights   07/21/17 1507 07/21/17 1530 07/23/17 0600  Weight: 220 lb 0.3 oz (99.8 kg) 220 lb (99.8 kg) 116 lb 11.2 oz (52.9 kg)    Labs & Radiologic Studies    CBC Recent Labs    07/21/17 1427 07/22/17 0316  WBC 10.9* 12.1*  NEUTROABS 7.7  --   HGB 15.9 14.4  HCT 46.4 43.8  MCV 84.8 88.8  PLT 257 239   Basic Metabolic Panel Recent Labs    96/04/54 1427 07/22/17 0316  NA 138 136  K 3.9 3.9  CL 108 102  CO2 16* 24  GLUCOSE 145* 122*  BUN 11 9  CREATININE 1.15 0.98  CALCIUM 9.8 9.0   Liver Function Tests Recent Labs    07/21/17 1427  AST 35  ALT 52*  ALKPHOS 78  BILITOT 0.9  PROT 7.8  ALBUMIN 4.4   No results for input(s): LIPASE,  AMYLASE in the last 72 hours. Cardiac Enzymes Recent Labs    07/21/17 1427 07/21/17 2100 07/22/17 0316  TROPONINI <0.03 9.32* 10.95*   BNP Invalid input(s): POCBNP D-Dimer No results for input(s): DDIMER in the last 72 hours. Hemoglobin A1C Recent Labs    07/21/17 1427  HGBA1C 6.0*   Fasting Lipid Panel Recent Labs    07/22/17 0316  CHOL 223*  HDL 41  LDLCALC 130*  TRIG 260*  CHOLHDL 5.4   Thyroid Function Tests No results for input(s): TSH, T4TOTAL, T3FREE, THYROIDAB in the last 72 hours.  Invalid input(s): FREET3 _____________  No results found. Disposition   Pt is being discharged home today in good condition.  Follow-up Plans & Appointments    Follow-up Information    Tonny Bollman, MD Follow up.   Specialty:  Cardiology Why:  our office will call you with a hospital follow up visit in 1-2 weeks.  Contact information: [address]  [address] [place] [address] [phone]        [facility]. Go on 08/25/2017.   Why:  3:10 pm with Dr. Shon Hale Flemmings Contact information: [address] [place] [address] [phone]         Discharge Instructions    Amb Referral to Cardiac Rehabilitation   Complete by:  As directed    Diagnosis:   Coronary Stents PTCA STEMI     Diet - low sodium heart healthy   Complete by:  As directed    Increase activity slowly   Complete by:  As directed       Discharge Medications   Allergies as of 07/23/2017   No Known Allergies     Medication List    STOP taking these medications   aspirin EC 325 MG tablet Replaced by:  aspirin 81 MG chewable tablet     TAKE these medications   aspirin 81 MG chewable tablet Chew 1 tablet (81 mg total) by mouth daily. Replaces:  aspirin EC 325 MG tablet   atorvastatin 80 MG tablet Commonly known as:  LIPITOR Take 1 tablet (80 mg total) by mouth daily at 6 PM.   carvedilol 3.125 MG  tablet Commonly known as:  COREG Take 1 tablet (3.125 mg total) by mouth 2 (two) times daily with a meal.   losartan 25 MG tablet Commonly known as:  COZAAR Take 1 tablet (25 mg total) by mouth daily.   nitroGLYCERIN 0.4 MG SL tablet Commonly known as:  NITROSTAT Place 1 tablet (0.4 mg total) under the tongue every 5 (five) minutes x 3 doses as needed for chest pain.   ranitidine 150 MG tablet Commonly known as:  ZANTAC Take 1 tablet (150 mg total) by mouth 2 (two) times daily.   ticagrelor 90 MG Tabs tablet Commonly known as:  BRILINTA Take 1 tablet (90 mg total) by mouth 2 (two) times daily.   ticagrelor 90 MG Tabs tablet Commonly known as:  BRILINTA Take 1 tablet (90 mg total) by mouth 2 (two) times daily.        Acute coronary syndrome (MI, NSTEMI, STEMI, etc) this admission?: Yes.     AHA/ACC Clinical Performance & Quality Measures: 1. Aspirin prescribed? - Yes 2. ADP Receptor Inhibitor (Plavix/Clopidogrel, Brilinta/Ticagrelor or Effient/Prasugrel) prescribed (includes medically managed patients)? - Yes 3. Beta Blocker prescribed? - Yes 4. High Intensity Statin (Lipitor 40-80mg  or Crestor 20-40mg ) prescribed? - Yes 5. EF assessed during THIS hospitalization? - Yes 6. For EF <40%, was ACEI/ARB prescribed? - Yes 7. For EF <40%, Aldosterone Antagonist (Spironolactone or Eplerenone) prescribed? - No - Reason:  soft BP 8. Cardiac Rehab Phase II ordered (Included Medically managed Patients)? - Yes     Outstanding Labs/Studies   F/u FLP and HFTs in 6-8 weeks  Duration of Discharge Encounter   Greater than 30 minutes including physician time.  Signed, Robbie Lis, PA-C 07/23/2017, 1:36 PM

## 2017-07-23 NOTE — Progress Notes (Addendum)
Progress Note  Patient Name: Derrick Villa Date of Encounter: 07/23/2017  Primary Cardiologist: Dr. Excell Seltzer  Subjective   Doing ok post STEMI. No CP or dyspnea. Ambulating ok. Ready to go home.   Inpatient Medications    Scheduled Meds: . aspirin  81 mg Oral Daily  . atorvastatin  80 mg Oral q1800  . carvedilol  3.125 mg Oral BID WC  . Chlorhexidine Gluconate Cloth  6 each Topical Q0600  . folic acid  1 mg Oral Daily  . heparin  5,000 Units Subcutaneous Q8H  . LORazepam  0-4 mg Oral Q6H   Followed by  . LORazepam  0-4 mg Oral Q12H  . losartan  25 mg Oral Daily  . multivitamin with minerals  1 tablet Oral Daily  . mupirocin ointment  1 application Nasal BID  . sodium chloride flush  3 mL Intravenous Q12H  . thiamine  100 mg Oral Daily   Or  . thiamine  100 mg Intravenous Daily  . ticagrelor  90 mg Oral BID   Continuous Infusions: . sodium chloride     PRN Meds: sodium chloride, acetaminophen, LORazepam **OR** LORazepam, morphine injection, nitroGLYCERIN, ondansetron (ZOFRAN) IV, oxyCODONE, sodium chloride flush   Vital Signs    Vitals:   07/22/17 1502 07/22/17 1718 07/22/17 1953 07/23/17 0600  BP: 134/81 (!) 152/84 122/89 132/82  Pulse: 84 87 85 78  Resp: 12  12 16   Temp: 98.5 F (36.9 C)  99.1 F (37.3 C) 98.4 F (36.9 C)  TempSrc: Oral  Oral Oral  SpO2:   99% 98%  Weight:    116 lb 11.2 oz (52.9 kg)  Height:        Intake/Output Summary (Last 24 hours) at 07/23/2017 0657 Last data filed at 07/22/2017 2128 Gross per 24 hour  Intake 963 ml  Output 400 ml  Net 563 ml   Filed Weights   07/21/17 1507 07/21/17 1530 07/23/17 0600  Weight: 220 lb 0.3 oz (99.8 kg) 220 lb (99.8 kg) 116 lb 11.2 oz (52.9 kg)    Telemetry    NSr - Personally Reviewed  ECG    Normal sinus rhythm ST & Marked T wave abnormality, consider anterolateral ischemia Prolonged QT Abnormal ECG - Personally Reviewed and discussed with Dr. Excell Seltzer-- this is secondary large MI,  evolutionary EKG changes  Physical Exam   GEN: No acute distress.   Neck: No JVD Cardiac: RRR, no murmurs, rubs, or gallops.  Respiratory: Clear to auscultation bilaterally. GI: Soft, nontender, non-distended  MS: No edema; No deformity. Neuro:  Nonfocal  Psych: Normal affect   Labs    Chemistry Recent Labs  Lab 07/21/17 1424 07/21/17 1427 07/22/17 0316  NA 142 138 136  K 3.9 3.9 3.9  CL 108 108 102  CO2  --  16* 24  GLUCOSE 146* 145* 122*  BUN 13 11 9   CREATININE 0.90 1.15 0.98  CALCIUM  --  9.8 9.0  PROT  --  7.8  --   ALBUMIN  --  4.4  --   AST  --  35  --   ALT  --  52*  --   ALKPHOS  --  78  --   BILITOT  --  0.9  --   GFRNONAA  --  >60 >60  GFRAA  --  >60 >60  ANIONGAP  --  14 10     Hematology Recent Labs  Lab 07/21/17 1424 07/21/17 1427 07/22/17 0316  WBC  --  10.9* 12.1*  RBC  --  5.47 4.93  HGB 16.3 15.9 14.4  HCT 48.0 46.4 43.8  MCV  --  84.8 88.8  MCH  --  29.1 29.2  MCHC  --  34.3 32.9  RDW  --  12.8 13.2  PLT  --  257 239    Cardiac Enzymes Recent Labs  Lab 07/21/17 1427 07/21/17 2100 07/22/17 0316  TROPONINI <0.03 9.32* 10.95*   No results for input(s): TROPIPOC in the last 168 hours.   BNP Recent Labs  Lab 07/21/17 1423  BNP 11.8     DDimer No results for input(s): DDIMER in the last 168 hours.   Radiology    No results found.  Cardiac Studies   Echo 07-21-2017: Study Conclusions  - Left ventricle: The cavity size was normal. Wall thickness was increased in a pattern of mild LVH. Systolic function was mildly to moderately reduced. The estimated ejection fraction was in the range of 40% to 45%. There is hypokinesis of the mid-apicalanteroseptal and apical myocardium. Doppler parameters are consistent with abnormal left ventricular relaxation (grade 1 diastolic dysfunction).  Impressions:  - Hypokinesis of the distal septum and apex; overall mild to moderate LV dysfunction (EF 40); mild  diastolic dysfunction; mild LVH.  Cath: Conclusion     There is severe left ventricular systolic dysfunction.  LV end diastolic pressure is mildly elevated.  The left ventricular ejection fraction is 25-35% by visual estimate.  Prox LAD-1 lesion is 50% stenosed.  Prox LAD-2 lesion is 100% stenosed.  A drug-eluting stent was successfully placed using a STENT SYNERGY DES 4X38.  Post intervention, there is a 0% residual stenosis.  Post intervention, there is a 0% residual stenosis.  Mid RCA lesion is 30% stenosed.  Prox Cx to Mid Cx lesion is 30% stenosed.  1. Acute anterior STEMI secondary to total occlusion of the LAD, treated successfully with primary PCI using a 4.0 x 38 mm Synergy DES 2. Patent left main, left circumflex, and RCA with mild nonobstructive disease 3. Severe segmental LV systolic dysfunction consistent with a large anterior MI 4. Ventricular fibrillation post reperfusion requiring defibrillation x2  Recommend uninterrupted dual antiplatelet therapy with Aspirin 81mg  daily and Ticagrelor 90mg  twice dailyfor a minimum of 12 months (ACS - Class I recommendation).    Patient Profile     52 y.o. male with hx of polysubstance abuse presents with anterior STEMI, treated with Primary PCI using a DES in the LAD.  Assessment & Plan    1. CAD s/p Anterior STEMI: Troponin peaked at 10.95. Complete angiographic details outlined above in cath report. He is s/p PCI + DES placement to the prox LAD. EF mildly reduced at 40-45% by echo. CP free. Continue DAPT w/ ASA + Brilinta for a minimum of 12 months, along with high intensity statin, BB and ARB. Can repeat echo in several weeks to recheck EF.   2. HLD: LDL elevated at 130 mg/dL. Goal in the setting of CAD and recent STEMI is < 70 mg/dL. High intensity statin therapy has been initiated. Lipitor 80 mg nightly. We will recheck FLP and HFTs again in the office in 6-8 weeks. If not at goal despite high intensity  statin, we can later add Zetia.   3. Tobacco Abuse: smoking cessation strongly advised.   4. Alcohol/Cocaine Abuse: counseling done.  He understands that any repeated use of cocaine can potentially be life-threatening in the setting of his recent myocardial infarction and beta-blocker therapy.  The patient  is counseled as such.  He has not used in 2 months and feels he can quit completely.  Dispo: d/c home later today.   For questions or updates, please contact [facility] Please consult [email or web address] for contact info under Cardiology/STEMI.   Signed, Robbie LisBrittainy Simmons, PA-C  07/23/2017, 6:57 AM    Patient seen, examined. Available data reviewed. Agree with findings, assessment, and plan as outlined by Robbie LisBrittainy Simmons, PA-C. On my exam: Vitals:   07/22/17 1953 07/23/17 0600  BP: 122/89 132/82  Pulse: 85 78  Resp: 12 16  Temp: 99.1 F (37.3 C) 98.4 F (36.9 C)  SpO2: 99% 98%   Pt is alert and oriented, NAD HEENT: normal Neck: JVP - normal Lungs: CTA bilaterally CV: RRR without murmur or gallop Abd: soft, NT, Positive BS, no hepatomegaly Ext: no C/C/E, distal pulses intact and equal Skin: warm/dry no rash  Pt is stable and ready for DC. He should be out of work x 4-6 weeks after an anterior MI. 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 Medical program is reviewed and agree with medications as outlined above. Importance of medication adherence, tobacco cessation, and drug/alcohol cessation reviewed again with the patient.   Tonny BollmanMichael Anacarolina Evelyn, M.D. 07/23/2017 7:48 AM

## 2017-07-28 ENCOUNTER — Telehealth (HOSPITAL_COMMUNITY): Payer: Self-pay

## 2017-07-28 NOTE — Telephone Encounter (Signed)
Called patient in regards to Cardiac Rehab maintenance - Not interested. Closed referral.

## 2017-08-17 ENCOUNTER — Encounter: Payer: Self-pay | Admitting: *Deleted

## 2017-08-17 ENCOUNTER — Ambulatory Visit (INDEPENDENT_AMBULATORY_CARE_PROVIDER_SITE_OTHER): Payer: Self-pay | Admitting: Physician Assistant

## 2017-08-17 ENCOUNTER — Telehealth: Payer: Self-pay | Admitting: *Deleted

## 2017-08-17 ENCOUNTER — Encounter: Payer: Self-pay | Admitting: Physician Assistant

## 2017-08-17 VITALS — BP 120/82 | HR 61 | Ht 72.0 in | Wt 218.8 lb

## 2017-08-17 DIAGNOSIS — I5022 Chronic systolic (congestive) heart failure: Secondary | ICD-10-CM

## 2017-08-17 DIAGNOSIS — I10 Essential (primary) hypertension: Secondary | ICD-10-CM | POA: Insufficient documentation

## 2017-08-17 DIAGNOSIS — E785 Hyperlipidemia, unspecified: Secondary | ICD-10-CM | POA: Insufficient documentation

## 2017-08-17 DIAGNOSIS — I255 Ischemic cardiomyopathy: Secondary | ICD-10-CM | POA: Insufficient documentation

## 2017-08-17 DIAGNOSIS — F17200 Nicotine dependence, unspecified, uncomplicated: Secondary | ICD-10-CM

## 2017-08-17 DIAGNOSIS — I2102 ST elevation (STEMI) myocardial infarction involving left anterior descending coronary artery: Secondary | ICD-10-CM

## 2017-08-17 MED ORDER — ATORVASTATIN CALCIUM 80 MG PO TABS: 80.0000 mg | ORAL_TABLET | Freq: Every day | ORAL | 3 refills | Status: DC

## 2017-08-17 MED ORDER — LOSARTAN POTASSIUM 25 MG PO TABS: 25.0000 mg | ORAL_TABLET | Freq: Every day | ORAL | 3 refills | Status: DC

## 2017-08-17 MED ORDER — CLOPIDOGREL BISULFATE 75 MG PO TABS: 75.0000 mg | ORAL_TABLET | Freq: Every day | ORAL | 3 refills | Status: DC

## 2017-08-17 MED ORDER — CARVEDILOL 3.125 MG PO TABS: 3.1250 mg | ORAL_TABLET | Freq: Two times a day (BID) | ORAL | 3 refills | Status: DC

## 2017-08-17 NOTE — Patient Instructions (Addendum)
MEDICATION  CHANGES  COMPLETE TAKING BRILINTA ONCE BOTTLE IS EMPTY,THEN START CLOPIDOGREL 75 MG DAILY - THE FIRST DOSE TAKE 600 MG ( 8 TABLETS), THEN CONTINUE WITH ONE TABLET DAILY.    LABS TODAY  CBC BMP   Your physician recommends that you schedule a follow-up appointment in 3 MONTHS WITH DR Excell SeltzerOOPER

## 2017-08-17 NOTE — Telephone Encounter (Signed)
LEFT MESSAGE TO CALL BACK-- NEED TO HAVE LABS DONE IN 3-4 WEEKS--- LIPID ,HEPATIC LEVELS-  NOT MENTION  AT OFFICE APPOINTMENT  MAILED LETTER

## 2017-08-17 NOTE — Addendum Note (Signed)
Addended by: Tobin ChadMARTIN, Mabelle Mungin V on: 08/17/2017 01:46 PM   Modules accepted: Orders
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
## 2017-08-18 LAB — BASIC METABOLIC PANEL
BUN / CREAT RATIO: 15 (ref 9–20)
BUN: 14 mg/dL (ref 6–24)
CHLORIDE: 106 mmol/L (ref 96–106)
CO2: 19 mmol/L — ABNORMAL LOW (ref 20–29)
Calcium: 9.7 mg/dL (ref 8.7–10.2)
Creatinine, Ser: 0.95 mg/dL (ref 0.76–1.27)
GFR calc non Af Amer: 92 mL/min/{1.73_m2} (ref >59)
GFR, EST AFRICAN AMERICAN: 107 mL/min/{1.73_m2} (ref >59)
GLUCOSE: 88 mg/dL (ref 65–99)
POTASSIUM: 4.4 mmol/L (ref 3.5–5.2)
SODIUM: 141 mmol/L (ref 134–144)

## 2017-08-18 LAB — CBC
Hematocrit: 43.7 % (ref 37.5–51.0)
Hemoglobin: 14.9 g/dL (ref 13.0–17.7)
MCH: 29.8 pg (ref 26.6–33.0)
MCHC: 34.1 g/dL (ref 31.5–35.7)
MCV: 87 fL (ref 79–97)
PLATELETS: 265 10*3/uL (ref 150–450)
RBC: 5 x10E6/uL (ref 4.14–5.80)
RDW: 13.1 % (ref 12.3–15.4)
WBC: 7.3 10*3/uL (ref 3.4–10.8)

## 2017-08-18 NOTE — Telephone Encounter (Signed)
Follow Up:  Patient calling back stating someone called today, didn' t leave a name or a reason for the call.

## 2017-08-18 NOTE — Telephone Encounter (Signed)
Pt given CBC and BMET results and will switch to Plavix after her finishes his Brilinta. Pt also advised to return in 4 weeks fasting for Lipid and Hepatic Panels. Pt verbalized understanding and agrees.

## 2017-08-18 NOTE — Telephone Encounter (Signed)
Left message for pt to call back  °

## 2017-08-25 ENCOUNTER — Inpatient Hospital Stay: Payer: Self-pay | Admitting: Nurse Practitioner

## 2017-09-08 ENCOUNTER — Other Ambulatory Visit: Payer: Self-pay

## 2017-09-08 ENCOUNTER — Ambulatory Visit: Payer: Self-pay | Attending: Family Medicine | Admitting: Family Medicine

## 2017-09-08 ENCOUNTER — Encounter: Payer: Self-pay | Admitting: Family Medicine

## 2017-09-08 VITALS — BP 131/82 | HR 74 | Temp 98.4°F | Resp 18 | Ht 72.0 in | Wt 222.2 lb

## 2017-09-08 DIAGNOSIS — Z720 Tobacco use: Secondary | ICD-10-CM

## 2017-09-08 DIAGNOSIS — Z8249 Family history of ischemic heart disease and other diseases of the circulatory system: Secondary | ICD-10-CM | POA: Insufficient documentation

## 2017-09-08 DIAGNOSIS — I5022 Chronic systolic (congestive) heart failure: Secondary | ICD-10-CM

## 2017-09-08 DIAGNOSIS — I10 Essential (primary) hypertension: Secondary | ICD-10-CM

## 2017-09-08 DIAGNOSIS — I2102 ST elevation (STEMI) myocardial infarction involving left anterior descending coronary artery: Secondary | ICD-10-CM

## 2017-09-08 DIAGNOSIS — Z7982 Long term (current) use of aspirin: Secondary | ICD-10-CM | POA: Insufficient documentation

## 2017-09-08 DIAGNOSIS — B9689 Other specified bacterial agents as the cause of diseases classified elsewhere: Secondary | ICD-10-CM | POA: Insufficient documentation

## 2017-09-08 DIAGNOSIS — J019 Acute sinusitis, unspecified: Secondary | ICD-10-CM

## 2017-09-08 DIAGNOSIS — Z7902 Long term (current) use of antithrombotics/antiplatelets: Secondary | ICD-10-CM

## 2017-09-08 DIAGNOSIS — Z23 Encounter for immunization: Secondary | ICD-10-CM | POA: Insufficient documentation

## 2017-09-08 DIAGNOSIS — H6690 Otitis media, unspecified, unspecified ear: Secondary | ICD-10-CM | POA: Insufficient documentation

## 2017-09-08 DIAGNOSIS — Z79899 Other long term (current) drug therapy: Secondary | ICD-10-CM | POA: Insufficient documentation

## 2017-09-08 DIAGNOSIS — I2510 Atherosclerotic heart disease of native coronary artery without angina pectoris: Secondary | ICD-10-CM

## 2017-09-08 DIAGNOSIS — K5792 Diverticulitis of intestine, part unspecified, without perforation or abscess without bleeding: Secondary | ICD-10-CM

## 2017-09-08 DIAGNOSIS — I255 Ischemic cardiomyopathy: Secondary | ICD-10-CM

## 2017-09-08 DIAGNOSIS — E785 Hyperlipidemia, unspecified: Secondary | ICD-10-CM

## 2017-09-08 DIAGNOSIS — F17210 Nicotine dependence, cigarettes, uncomplicated: Secondary | ICD-10-CM | POA: Insufficient documentation

## 2017-09-08 DIAGNOSIS — Z955 Presence of coronary angioplasty implant and graft: Secondary | ICD-10-CM | POA: Insufficient documentation

## 2017-09-08 DIAGNOSIS — I110 Hypertensive heart disease with heart failure: Secondary | ICD-10-CM | POA: Insufficient documentation

## 2017-09-08 MED ORDER — ASPIRIN 81 MG PO CHEW: 81.0000 mg | CHEWABLE_TABLET | Freq: Every day | ORAL | 11 refills | Status: DC

## 2017-09-08 MED ORDER — NITROGLYCERIN 0.4 MG SL SUBL: 0.4000 mg | SUBLINGUAL_TABLET | SUBLINGUAL | 2 refills | Status: DC | PRN

## 2017-09-08 MED ORDER — RANITIDINE HCL 150 MG PO TABS: 150.0000 mg | ORAL_TABLET | Freq: Two times a day (BID) | ORAL | 3 refills | Status: DC

## 2017-09-08 MED ORDER — ATORVASTATIN CALCIUM 80 MG PO TABS: 80.0000 mg | ORAL_TABLET | Freq: Every day | ORAL | 3 refills | Status: DC

## 2017-09-08 MED ORDER — LOSARTAN POTASSIUM 25 MG PO TABS: 25.0000 mg | ORAL_TABLET | Freq: Every day | ORAL | 3 refills | Status: DC

## 2017-09-08 MED ORDER — TICAGRELOR 60 MG PO TABS: 90.0000 mg | ORAL_TABLET | Freq: Two times a day (BID) | ORAL | Status: DC

## 2017-09-08 MED ORDER — CARVEDILOL 3.125 MG PO TABS: 3.1250 mg | ORAL_TABLET | Freq: Two times a day (BID) | ORAL | 3 refills | Status: DC

## 2017-09-08 MED ORDER — TICAGRELOR 90 MG PO TABS: 90.0000 mg | ORAL_TABLET | Freq: Two times a day (BID) | ORAL | 3 refills | Status: DC

## 2017-09-08 MED ORDER — AMOXICILLIN 500 MG PO CAPS: 500.0000 mg | ORAL_CAPSULE | Freq: Three times a day (TID) | ORAL | 0 refills | Status: DC

## 2017-09-08 NOTE — Patient Instructions (Signed)
Atherosclerosis °Atherosclerosis is narrowing and hardening of the blood vessels (arteries). Arteries are tubes that carry blood that contains oxygen from the heart to all parts of the body. Arteries can become narrow or clogged with a buildup of fat, cholesterol, calcium, or other substances (plaque). Plaque decreases the amount of blood that can flow through the artery. °Atherosclerosis can affect any artery in the body, including: °· Heart arteries (coronary artery disease), which may cause heart attack. °· Brain arteries, which may cause stroke. °· Leg, arm, and pelvis arteries (peripheral artery disease), which may cause pain and numbness. °· Kidney arteries, which may cause kidney (renal) failure. ° °Treatment may slow the disease and prevent further damage to the heart, brain, peripheral arteries, and kidneys. °What are the causes? °Atherosclerosis develops when plaque forms in an artery. This damages the inside wall of the artery. Over time, the plaque grows and hardens. It may break through the artery wall. This causes a blood clot to form over the break, which narrows the artery more. The clot may also break loose and travel to other arteries, causing more damage. °What increases the risk? °This condition is more likely to develop in people who: °· Are middle age or older. °· Have a family history of atherosclerosis. °· Have high cholesterol. °· Have high blood fats (triglycerides). °· Have diabetes. °· Are overweight. °· Smoke tobacco. °· Do not exercise enough. °· Have a substance in the blood that indicates increased levels of inflammation in the body (C-reactive protein, or CRP). °· Have sleep apnea. °· Are stressed. °· Drink too much alcohol. ° °What are the signs or symptoms? °This condition may not cause any symptoms. If you do have symptoms, they are caused by damage to an area of your body that is not getting enough blood. The following symptoms are possible: °· Coronary artery disease may cause  chest pain and shortness of breath. °· Decreased blood supply to your brain may cause a stroke. Signs and symptoms of stroke may include sudden: °? Weakness on one side of the body. °? Confusion. °? Changes in vision. °? Inability to speak or understand speech. °? Loss of balance, coordination, or ability to walk. °? Severe headache. °? Loss of consciousness. °· Peripheral artery disease may cause pain and numbness, often in the legs and hips. °· Renal failure may cause fatigue, nausea, swelling, and itchy skin. ° °How is this diagnosed? °This condition is diagnosed based on your medical history and a physical exam. During the exam, your health care provider will check your pulses and listen for a "whooshing" sound over your arteries (bruit). You may have tests, such as: °· Blood tests to check your levels of cholesterol, triglycerides, and CRP. °· Electrocardiogram (ECG) to check for heart damage. °· Chest X-ray to see if your heart is enlarged, which is a sign of heart failure. °· Stress test to see how your heart reacts to exercise. °· Echocardiogram to get images of the inside of your heart. °· Ankle-Brachial index to compare blood pressure in your arms to blood pressure in your ankles. °· Ultrasound of your peripheral arteries to check blood flow. °· CT scan to check for damage to your heart or brain. °· X-rays of blood vessels after dye has been injected (angiogram) to check blood flow. ° °How is this treated? °Treatment starts with lifestyle changes, which may include: °· Changing your diet. °· Losing weight. °· Reducing stress. °· Exercising and being more physically active. °· Not smoking. ° °  You also may need medicine to: °· Lower triglycerides and cholesterol. °· Lower and control blood pressure. °· Prevent blood clots. °· Lower inflammation in your body. °· Lower and control your blood sugar. ° °Sometimes, surgery is needed to remove plaque, widen your artery, or create a new path for your blood  (bypass). Surgical treatment may include: °· Removing plaque from an artery (endarterectomy). °· Opening a narrowed heart artery (angioplasty). °· Heart or peripheral artery bypass graft surgery. ° °Follow these instructions at home: °Lifestyle ° °· Eat a heart-healthy diet. Talk to your health care provider or a diet specialist (dietitian) if you need help. A heart-healthy diet includes: °? Limiting unhealthy fats and increasing healthy fats. Some examples of healthy fats are olive oil and canola oil. °? Eating plant-based foods, such as fruits, vegetables, nuts, legumes, and whole grains. °· Follow an exercise program as told by your health care provider. °· Maintain a healthy weight. Lose weight if directed by your health care provider. °· Rest when you are tired. °· Learn to manage your stress. °· Do not use any tobacco products, such as cigarettes, chewing tobacco, and e-cigarettes. If you need help quitting, ask your health care provider. °· Limit alcohol intake to no more than 1 drink a day for nonpregnant women and 2 drinks a day for men. One drink equals 12 oz of beer, 5 oz of wine, or 1½ oz of hard liquor. °· Do not abuse drugs. °General instructions °· Take over-the-counter and prescription medicines only as told by your health care provider. °· Manage other health conditions as told by your health care provider. °· Keep all follow-up visits as told by your health care provider. This is important. °Contact a health care provider if: °· You have chest pain or discomfort. This includes squeezing chest pain that may feel like indigestion (angina). °· You have shortness of breath. °· You have an irregular heartbeat. °· You have unexplained fatigue. °· You have unexplained pain or numbness in an arm, leg, or hip. °· You have nausea, swelling of your hands or feet, and itchy skin. °Get help right away if: °· You have symptoms of a heart attack, such as: °? Chest pain. °? Shortness of breath. °? Pain in your  neck, jaw, arms, back, or stomach. °? Cold sweat. °? Nausea. °? Light-headedness. °· You have symptoms of a stroke, such as sudden: °? Weakness on one side of your body. °? Confusion. °? Changes in vision. °? Inability to speak or understand speech. °? Loss of balance, coordination, or ability to walk. °? Severe headache. °? Loss of consciousness. °These symptoms may represent a serious problem that is an emergency. Do not wait to see if the symptoms will go away. Get medical help right away. Call your local emergency services (911 in the [place] Do not drive yourself to the hospital. °This information is not intended to replace advice given to you by your health care provider. Make sure you discuss any questions you have with your health care provider. °Document Released: 03/21/2003 Document Revised: 06/06/2015 Document Reviewed: 11/19/2014 °[employer] Interactive Patient Education © 2018 [employer] ° °
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
## 2017-11-29 ENCOUNTER — Ambulatory Visit: Payer: Self-pay | Admitting: Cardiovascular Disease

## 2017-11-29 DIAGNOSIS — R0989 Other specified symptoms and signs involving the circulatory and respiratory systems: Secondary | ICD-10-CM

## 2017-11-30 ENCOUNTER — Encounter: Payer: Self-pay | Admitting: Cardiovascular Disease

## 2017-12-07 ENCOUNTER — Ambulatory Visit: Payer: Self-pay | Admitting: Family Medicine

## 2018-04-06 ENCOUNTER — Ambulatory Visit: Payer: Self-pay | Attending: Family Medicine | Admitting: Physician Assistant

## 2018-04-06 ENCOUNTER — Other Ambulatory Visit: Payer: Self-pay

## 2018-04-06 ENCOUNTER — Ambulatory Visit: Payer: Self-pay | Admitting: Family Medicine

## 2018-04-06 VITALS — BP 146/87 | HR 83 | Temp 98.3°F | Resp 16 | Wt 228.8 lb

## 2018-04-06 DIAGNOSIS — R110 Nausea: Secondary | ICD-10-CM

## 2018-04-06 DIAGNOSIS — K5900 Constipation, unspecified: Secondary | ICD-10-CM

## 2018-04-06 DIAGNOSIS — K5792 Diverticulitis of intestine, part unspecified, without perforation or abscess without bleeding: Secondary | ICD-10-CM

## 2018-04-06 DIAGNOSIS — R1010 Upper abdominal pain, unspecified: Secondary | ICD-10-CM

## 2018-04-06 MED ORDER — ONDANSETRON HCL 4 MG PO TABS: 4.0000 mg | ORAL_TABLET | Freq: Three times a day (TID) | ORAL | 0 refills | Status: DC | PRN

## 2018-04-06 MED ORDER — METRONIDAZOLE 500 MG PO TABS: 500.0000 mg | ORAL_TABLET | Freq: Three times a day (TID) | ORAL | 0 refills | Status: DC

## 2018-04-06 MED ORDER — CIPROFLOXACIN HCL 500 MG PO TABS: 500.0000 mg | ORAL_TABLET | Freq: Two times a day (BID) | ORAL | 0 refills | Status: DC

## 2018-04-06 MED FILL — ONDANSETRON HCL 4 MG TABLET: 4 | 3 days supply | Qty: 10 | Fill #0

## 2018-04-06 MED FILL — metroNIDAZOLE 500 MG TABS: 500 | 10 days supply | Qty: 30 | Fill #0

## 2018-04-06 MED FILL — CIPROFLOXACIN HCL 500 MG TA: 500 | 10 days supply | Qty: 20 | Fill #0

## 2018-04-06 NOTE — Progress Notes (Signed)
Patient ID: Derrick Villa, male   DOB: 16-Oct-1965, 53 y.o.   MRN: [id number]   Derrick Villa, is a 53 y.o. male  TFT:[id number]  HCW:[id number]  DOB - February 13, 1965  Subjective:  Chief Complaint and HPI: Derrick Villa is a 53 y.o. male here today with mid-upper abdomen and LLQ pain.  He has been having this for about 4 days and it seems to be worsening.  Feels like it did when he had diverticulitis several years ago.  No fever.  +nausea.  No vomiting.  Diarrhea 1st day but now is bloated and constipated.  Appetite is poor.  No OTC.  Hasn't had colonoscopy f/up.  Diverticulitis ~ 10 years ago resulted in a 10 day hospital stay.  With Covid concerns, he wanted to avoid the ED if possible.  No exacerbating or alleviating factors.     ROS:   Constitutional:  No f/c, No night sweats, No unexplained weight loss. EENT:  No vision changes, No blurry vision, No hearing changes. No mouth, throat, or ear problems.  Respiratory: No cough, No SOB Cardiac: No CP, no palpitations GI:  +abd pain, +N no V/D. GU: No Urinary s/sx Musculoskeletal: No joint pain Neuro: No headache, no dizziness, no motor weakness.  Skin: No rash Endocrine:  No polydipsia. No polyuria.  Psych: Denies SI/HI  No problems updated.  ALLERGIES: No Known Allergies  PAST MEDICAL HISTORY: Past Medical History:  Diagnosis Date  . CAD (coronary artery disease), native coronary artery   . Diverticulitis 09/2016  . Hyperlipidemia   . Hypertension   . NSTEMI (non-ST elevated myocardial infarction) [facility])     MEDICATIONS AT HOME: Prior to Admission medications   Medication Sig Start Date End Date Taking? Authorizing Provider  aspirin 81 MG chewable tablet Chew 1 tablet (81 mg total) by mouth daily. 09/08/17   Fulp, Cammie, MD  atorvastatin (LIPITOR) 80 MG tablet Take 1 tablet (80 mg total) by mouth daily at 6 PM. 09/08/17   Fulp, Cammie, MD  carvedilol (COREG) 3.125 MG tablet Take 1 tablet (3.125 mg total) by mouth 2 (two) times  daily with a meal. 09/08/17   Fulp, Cammie, MD  ciprofloxacin (CIPRO) 500 MG tablet Take 1 tablet (500 mg total) by mouth 2 (two) times daily. 04/06/18   Anders Simmonds, PA-C  losartan (COZAAR) 25 MG tablet Take 1 tablet (25 mg total) by mouth daily. 09/08/17   Fulp, Cammie, MD  metroNIDAZOLE (FLAGYL) 500 MG tablet Take 1 tablet (500 mg total) by mouth 3 (three) times daily. 04/06/18   Anders Simmonds, PA-C  nitroGLYCERIN (NITROSTAT) 0.4 MG SL tablet Place 1 tablet (0.4 mg total) under the tongue every 5 (five) minutes x 3 doses as needed for chest pain. 09/08/17   Fulp, Cammie, MD  ondansetron (ZOFRAN) 4 MG tablet Take 1 tablet (4 mg total) by mouth every 8 (eight) hours as needed for nausea or vomiting. 04/06/18   Anders Simmonds, PA-C  ranitidine (ZANTAC) 150 MG tablet Take 1 tablet (150 mg total) by mouth 2 (two) times daily. For stomach protection 09/08/17   Fulp, Cammie, MD  ticagrelor (BRILINTA) 90 MG TABS tablet Take 1 tablet (90 mg total) by mouth 2 (two) times daily. 09/08/17   Fulp, Cammie, MD     Objective:  EXAM:   Vitals:   04/06/18 1506  BP: (!) 146/87  Pulse: 83  Resp: 16  Temp: 98.3 F (36.8 C)  TempSrc: Oral  SpO2: 96%  Weight: 228 lb 12.8  oz (103.8 kg)    General appearance : A&OX3. NAD. Non-toxic-appearing HEENT: Atraumatic and Normocephalic.  PERRLA. EOM intact.   Neck: supple, no JVD. No cervical lymphadenopathy. No thyromegaly Chest/Lungs:  Breathing-non-labored, Good air entry bilaterally, breath sounds normal without rales, rhonchi, or wheezing  CVS: S1 S2 regular, no murmurs, gallops, rubs  Abdomen: Bowel sounds present, abdomen slightly distended with ventral hernia present. Abdomen soft and mildly tender in LLQ.  No guarding or rebound.   Extremities: Bilateral Lower Ext shows no edema, both legs are warm to touch with = pulse throughout Neurology:  CN II-XII grossly intact, Non focal.   Psych:  TP linear. J/I WNL. Normal speech. Appropriate eye contact  and affect.  Skin:  No Rash  Data Review Lab Results  Component Value Date   HGBA1C 6.0 (H) 07/21/2017     Assessment & Plan   1. Pain of upper abdomen Non-acute abdomen.  Will cover for diverticulitis due to history of diverticulitis.  Go to ED if you worsen or develop fever.   - H. pylori breath test - Comprehensive metabolic panel - Lipase - CBC with Differential/Platelet - Ambulatory referral to Gastroenterology  2. Constipation, unspecified constipation type Take metamucil and increase water intake  3. Nausea - H. pylori breath test - ondansetron (ZOFRAN) 4 MG tablet; Take 1 tablet (4 mg total) by mouth every 8 (eight) hours as needed for nausea or vomiting.  Dispense: 20 tablet; Refill: 0 - Ambulatory referral to Gastroenterology  4. Diverticulitis Will cover due to history-to ED if worsens - ciprofloxacin (CIPRO) 500 MG tablet; Take 1 tablet (500 mg total) by mouth 2 (two) times daily.  Dispense: 20 tablet; Refill: 0 - metroNIDAZOLE (FLAGYL) 500 MG tablet; Take 1 tablet (500 mg total) by mouth 3 (three) times daily.  Dispense: 30 tablet; Refill: 0 - Ambulatory referral to Gastroenterology   Patient have been counseled extensively about nutrition and exercise  Return in about 3 months (around 07/07/2018) for Dr Jillyn Hidden for Chronic medical conditions.  The patient was given clear instructions to go to ER or return to medical center if symptoms don't improve, worsen or new problems develop. The patient verbalized understanding. The patient was told to call to get lab results if they haven't heard anything in the next week.     Georgian Co, PA-C [facility] [place], [place] [phone]   04/06/2018, 3:30 PM

## 2018-04-07 LAB — CBC WITH DIFFERENTIAL/PLATELET
Basophils Absolute: 0.1 10*3/uL (ref 0.0–0.2)
Basos: 1 %
EOS (ABSOLUTE): 0.3 10*3/uL (ref 0.0–0.4)
Eos: 4 %
Hematocrit: 42.3 % (ref 37.5–51.0)
Hemoglobin: 14.9 g/dL (ref 13.0–17.7)
IMMATURE GRANULOCYTES: 0 %
Immature Grans (Abs): 0 10*3/uL (ref 0.0–0.1)
LYMPHS ABS: 1.8 10*3/uL (ref 0.7–3.1)
Lymphs: 27 %
MCH: 30 pg (ref 26.6–33.0)
MCHC: 35.2 g/dL (ref 31.5–35.7)
MCV: 85 fL (ref 79–97)
MONOS ABS: 0.4 10*3/uL (ref 0.1–0.9)
Monocytes: 6 %
NEUTROS ABS: 4.2 10*3/uL (ref 1.4–7.0)
Neutrophils: 62 %
PLATELETS: 276 10*3/uL (ref 150–450)
RBC: 4.97 x10E6/uL (ref 4.14–5.80)
RDW: 12.3 % (ref 11.6–15.4)
WBC: 6.8 10*3/uL (ref 3.4–10.8)

## 2018-04-07 LAB — COMPREHENSIVE METABOLIC PANEL
A/G RATIO: 1.8 (ref 1.2–2.2)
ALT: 40 IU/L (ref 0–44)
AST: 25 IU/L (ref 0–40)
Albumin: 4.4 g/dL (ref 3.8–4.9)
Alkaline Phosphatase: 101 IU/L (ref 39–117)
BILIRUBIN TOTAL: 0.3 mg/dL (ref 0.0–1.2)
BUN/Creatinine Ratio: 12 (ref 9–20)
BUN: 11 mg/dL (ref 6–24)
CHLORIDE: 101 mmol/L (ref 96–106)
CO2: 25 mmol/L (ref 20–29)
Calcium: 9.7 mg/dL (ref 8.7–10.2)
Creatinine, Ser: 0.94 mg/dL (ref 0.76–1.27)
GFR calc Af Amer: 107 mL/min/{1.73_m2} (ref >59)
GFR calc non Af Amer: 93 mL/min/{1.73_m2} (ref >59)
Globulin, Total: 2.5 g/dL (ref 1.5–4.5)
Glucose: 179 mg/dL — ABNORMAL HIGH (ref 65–99)
POTASSIUM: 4.1 mmol/L (ref 3.5–5.2)
Sodium: 142 mmol/L (ref 134–144)
Total Protein: 6.9 g/dL (ref 6.0–8.5)

## 2018-04-07 LAB — H. PYLORI BREATH TEST: H PYLORI BREATH TEST: NEGATIVE

## 2018-04-07 LAB — LIPASE: Lipase: 29 U/L (ref 13–78)

## 2018-04-11 ENCOUNTER — Encounter: Payer: Self-pay | Admitting: Internal Medicine

## 2018-07-11 ENCOUNTER — Ambulatory Visit: Payer: Self-pay | Admitting: Family Medicine

## 2018-07-19 ENCOUNTER — Ambulatory Visit: Payer: Self-pay | Admitting: Gastroenterology

## 2018-10-05 ENCOUNTER — Other Ambulatory Visit: Payer: Self-pay

## 2018-10-05 DIAGNOSIS — I2510 Atherosclerotic heart disease of native coronary artery without angina pectoris: Secondary | ICD-10-CM

## 2018-10-05 DIAGNOSIS — I2102 ST elevation (STEMI) myocardial infarction involving left anterior descending coronary artery: Secondary | ICD-10-CM

## 2018-10-05 DIAGNOSIS — E785 Hyperlipidemia, unspecified: Secondary | ICD-10-CM

## 2018-10-05 MED ORDER — ATORVASTATIN CALCIUM 80 MG PO TABS: 80.0000 mg | ORAL_TABLET | Freq: Every day | ORAL | 1 refills | Status: DC

## 2018-10-10 ENCOUNTER — Other Ambulatory Visit: Payer: Self-pay | Admitting: *Deleted

## 2018-10-10 DIAGNOSIS — I10 Essential (primary) hypertension: Secondary | ICD-10-CM

## 2018-10-10 MED ORDER — LOSARTAN POTASSIUM 25 MG PO TABS: 25.0000 mg | ORAL_TABLET | Freq: Every day | ORAL | 0 refills | Status: DC

## 2018-10-10 NOTE — Telephone Encounter (Signed)
Rx has been sent to the pharmacy electronically. ° °

## 2018-10-11 ENCOUNTER — Other Ambulatory Visit: Payer: Self-pay

## 2018-10-12 ENCOUNTER — Other Ambulatory Visit: Payer: Self-pay

## 2018-10-12 DIAGNOSIS — I10 Essential (primary) hypertension: Secondary | ICD-10-CM

## 2018-10-12 DIAGNOSIS — I2510 Atherosclerotic heart disease of native coronary artery without angina pectoris: Secondary | ICD-10-CM

## 2018-10-12 MED ORDER — CARVEDILOL 3.125 MG PO TABS: 3.1250 mg | ORAL_TABLET | Freq: Two times a day (BID) | ORAL | 0 refills | Status: DC

## 2019-01-04 ENCOUNTER — Ambulatory Visit: Payer: Self-pay | Attending: Family Medicine | Admitting: Family Medicine

## 2019-01-04 ENCOUNTER — Encounter: Payer: Self-pay | Admitting: Family Medicine

## 2019-01-04 DIAGNOSIS — I10 Essential (primary) hypertension: Secondary | ICD-10-CM

## 2019-01-04 DIAGNOSIS — Z8719 Personal history of other diseases of the digestive system: Secondary | ICD-10-CM

## 2019-01-04 DIAGNOSIS — I2510 Atherosclerotic heart disease of native coronary artery without angina pectoris: Secondary | ICD-10-CM

## 2019-01-04 DIAGNOSIS — E785 Hyperlipidemia, unspecified: Secondary | ICD-10-CM

## 2019-01-04 DIAGNOSIS — K921 Melena: Secondary | ICD-10-CM

## 2019-01-04 DIAGNOSIS — R350 Frequency of micturition: Secondary | ICD-10-CM

## 2019-01-04 DIAGNOSIS — I2102 ST elevation (STEMI) myocardial infarction involving left anterior descending coronary artery: Secondary | ICD-10-CM

## 2019-01-04 DIAGNOSIS — Z7902 Long term (current) use of antithrombotics/antiplatelets: Secondary | ICD-10-CM

## 2019-01-04 DIAGNOSIS — Z79899 Other long term (current) drug therapy: Secondary | ICD-10-CM

## 2019-01-04 DIAGNOSIS — Z7982 Long term (current) use of aspirin: Secondary | ICD-10-CM

## 2019-01-04 MED ORDER — ATORVASTATIN CALCIUM 80 MG PO TABS: ORAL_TABLET | ORAL | 1 refills | Status: DC

## 2019-01-04 MED ORDER — PANTOPRAZOLE SODIUM 40 MG PO TBEC: 40.0000 mg | DELAYED_RELEASE_TABLET | Freq: Every day | ORAL | 3 refills | Status: DC

## 2019-01-04 MED ORDER — CLOPIDOGREL BISULFATE 75 MG PO TABS: 75.0000 mg | ORAL_TABLET | Freq: Every day | ORAL | 1 refills | Status: DC

## 2019-01-04 MED ORDER — CARVEDILOL 3.125 MG PO TABS: 3.1250 mg | ORAL_TABLET | Freq: Two times a day (BID) | ORAL | 1 refills | Status: DC

## 2019-01-04 MED ORDER — LOSARTAN POTASSIUM 25 MG PO TABS: 25.0000 mg | ORAL_TABLET | Freq: Every day | ORAL | 0 refills | Status: DC
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
## 2019-01-04 NOTE — Progress Notes (Signed)
Patient verified DOB Patient has eaten today. Patient has taken medication today. Patient complains of blood in his stool intermittently for the past few months. Patient has a burning in the stomach.

## 2019-01-10 ENCOUNTER — Other Ambulatory Visit: Payer: Self-pay

## 2019-01-10 ENCOUNTER — Ambulatory Visit: Payer: Self-pay | Attending: Family Medicine

## 2019-01-10 DIAGNOSIS — E785 Hyperlipidemia, unspecified: Secondary | ICD-10-CM

## 2019-01-10 DIAGNOSIS — R350 Frequency of micturition: Secondary | ICD-10-CM

## 2019-01-10 DIAGNOSIS — K921 Melena: Secondary | ICD-10-CM

## 2019-01-10 DIAGNOSIS — Z79899 Other long term (current) drug therapy: Secondary | ICD-10-CM

## 2019-01-10 DIAGNOSIS — Z8719 Personal history of other diseases of the digestive system: Secondary | ICD-10-CM

## 2019-01-10 DIAGNOSIS — I2510 Atherosclerotic heart disease of native coronary artery without angina pectoris: Secondary | ICD-10-CM

## 2019-01-10 DIAGNOSIS — Z7902 Long term (current) use of antithrombotics/antiplatelets: Secondary | ICD-10-CM

## 2019-01-10 DIAGNOSIS — Z7982 Long term (current) use of aspirin: Secondary | ICD-10-CM

## 2019-01-11 LAB — COMPREHENSIVE METABOLIC PANEL WITH GFR
ALT: 41 IU/L (ref 0–44)
AST: 28 IU/L (ref 0–40)
Albumin/Globulin Ratio: 2 (ref 1.2–2.2)
Albumin: 4.6 g/dL (ref 3.8–4.9)
Alkaline Phosphatase: 97 IU/L (ref 39–117)
BUN/Creatinine Ratio: 10 (ref 9–20)
BUN: 10 mg/dL (ref 6–24)
Bilirubin Total: 0.4 mg/dL (ref 0.0–1.2)
CO2: 23 mmol/L (ref 20–29)
Calcium: 9.9 mg/dL (ref 8.7–10.2)
Chloride: 103 mmol/L (ref 96–106)
Creatinine, Ser: 1.03 mg/dL (ref 0.76–1.27)
GFR calc Af Amer: 95 mL/min/1.73
GFR calc non Af Amer: 83 mL/min/1.73
Globulin, Total: 2.3 g/dL (ref 1.5–4.5)
Glucose: 121 mg/dL — ABNORMAL HIGH (ref 65–99)
Potassium: 4.3 mmol/L (ref 3.5–5.2)
Sodium: 144 mmol/L (ref 134–144)
Total Protein: 6.9 g/dL (ref 6.0–8.5)

## 2019-01-11 LAB — CBC
Hematocrit: 43.3 % (ref 37.5–51.0)
Hemoglobin: 14.9 g/dL (ref 13.0–17.7)
MCH: 30.2 pg (ref 26.6–33.0)
MCHC: 34.4 g/dL (ref 31.5–35.7)
MCV: 88 fL (ref 79–97)
Platelets: 251 x10E3/uL (ref 150–450)
RBC: 4.93 x10E6/uL (ref 4.14–5.80)
RDW: 12.3 % (ref 11.6–15.4)
WBC: 8.5 x10E3/uL (ref 3.4–10.8)

## 2019-01-11 LAB — HEMOGLOBIN A1C
Est. average glucose Bld gHb Est-mCnc: 123 mg/dL
Hgb A1c MFr Bld: 5.9 % — ABNORMAL HIGH (ref 4.8–5.6)

## 2019-01-12 LAB — URINE CULTURE

## 2019-01-17 ENCOUNTER — Telehealth: Payer: Self-pay | Admitting: *Deleted

## 2019-01-17 NOTE — Telephone Encounter (Signed)
Medical Assistant left message on patient's home and cell voicemail. Voicemail states to give a call back to [place] with [facility] at [phone]. Patient is aware of UTI not being noted on the culture. Patient should follow up as planned.

## 2019-01-17 NOTE — Telephone Encounter (Signed)
-----   Message from Cain Saupe, MD sent at 01/12/2019  9:41 PM EST ----- No growth of bacteria that would cause a urinary tract infection

## 2019-02-01 ENCOUNTER — Encounter: Payer: Self-pay | Admitting: Family Medicine

## 2019-02-15 IMAGING — RF DG SINUS / FISTULA TRACT / ABSCESSOGRAM
3 series · 7 of 7 positions shown · non-contrast
Comparison: none

INDICATION: Sigmoid diverticulitis.

[Series 1: one shot · 2 of 2 slices shown (1 of 2)]
[im 1/2]
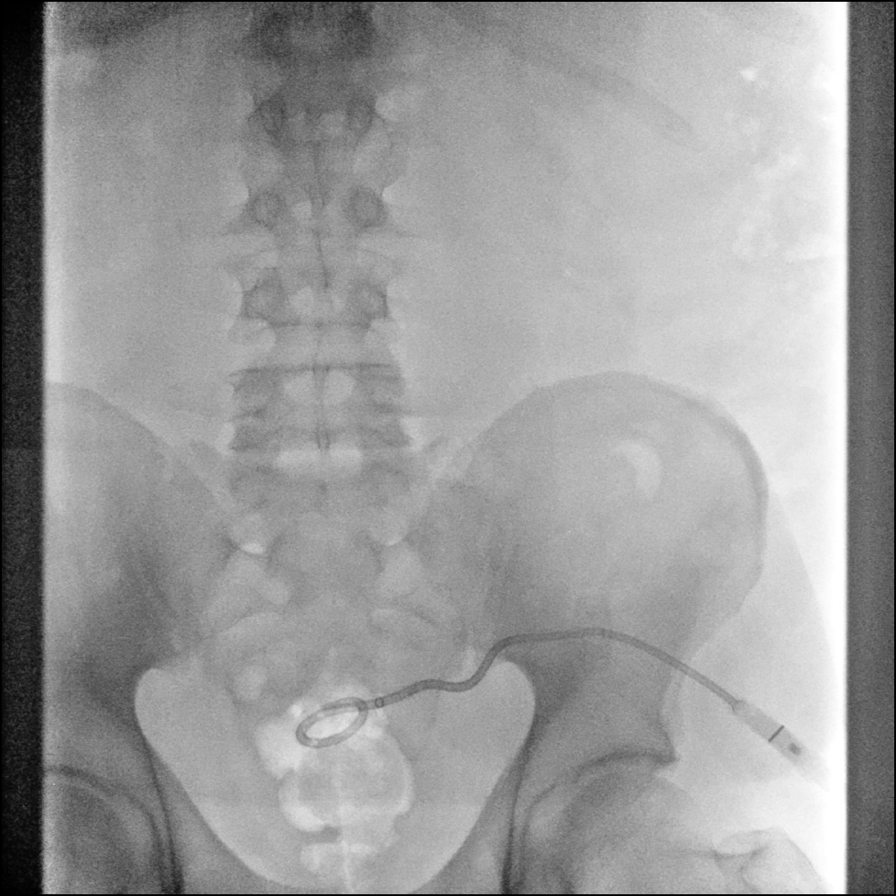
[im 2/2]
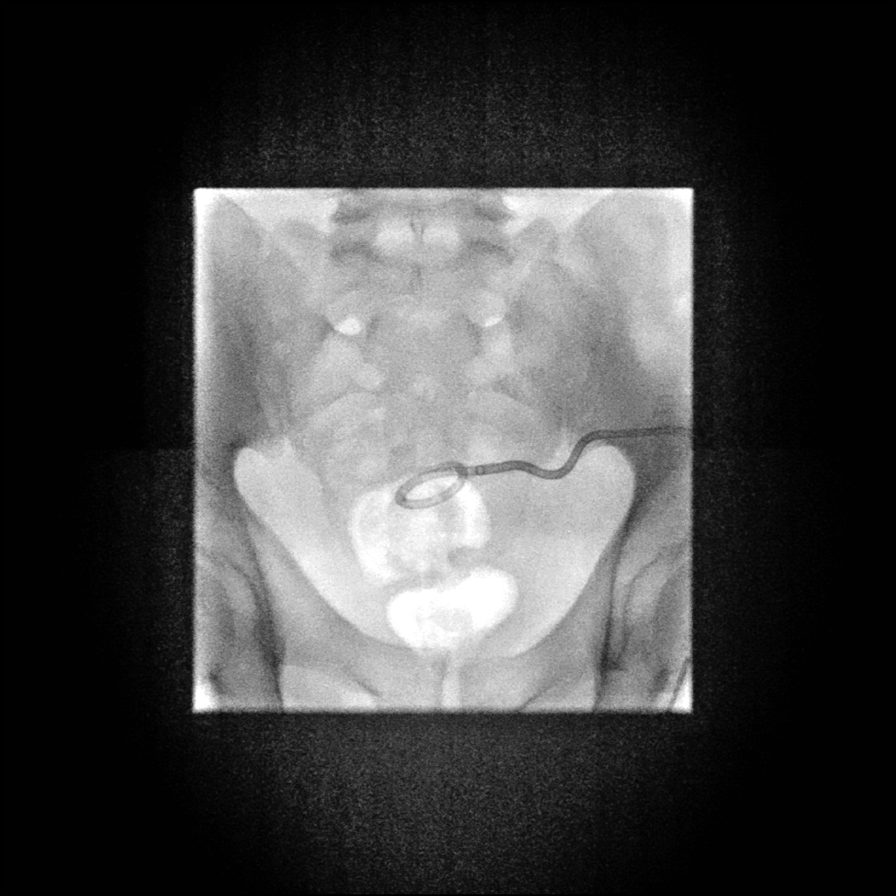

[Series 2: sequence · 4 of 17 frames shown]
[frame 3/17]
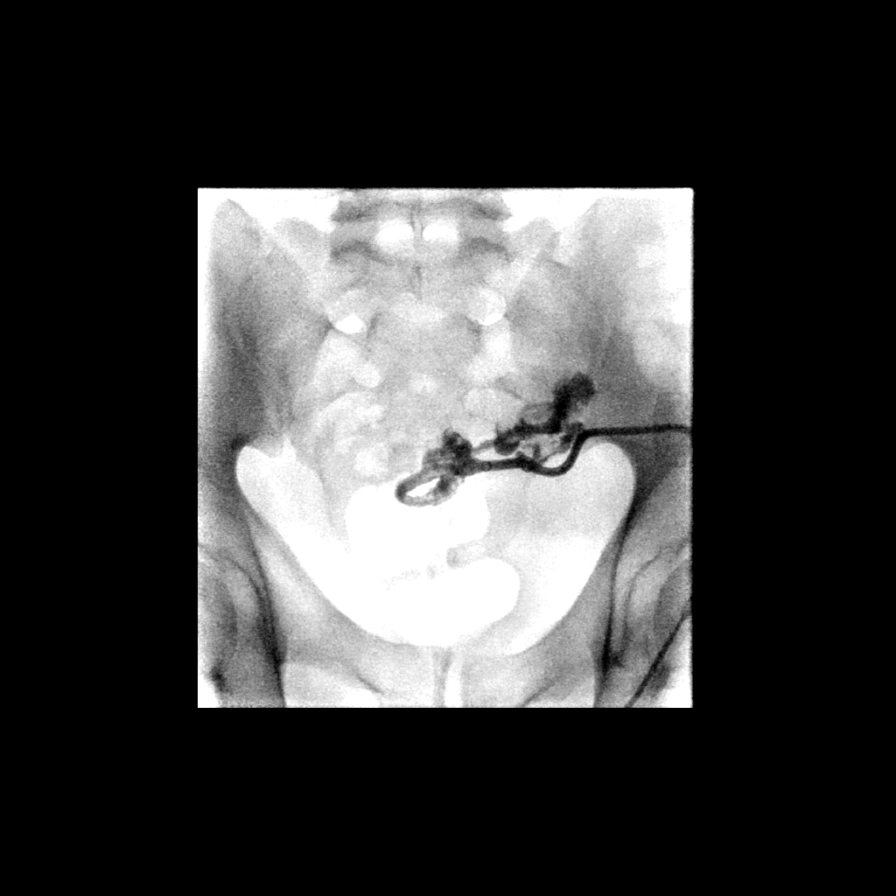
[frame 8/17]
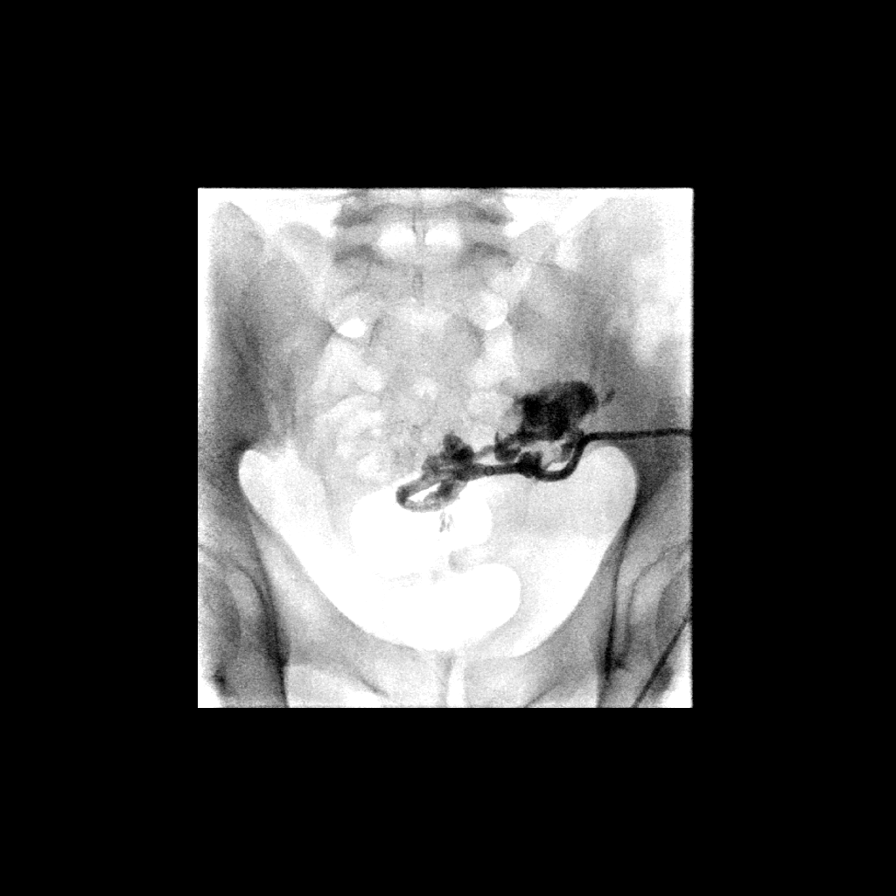
[frame 9/17]
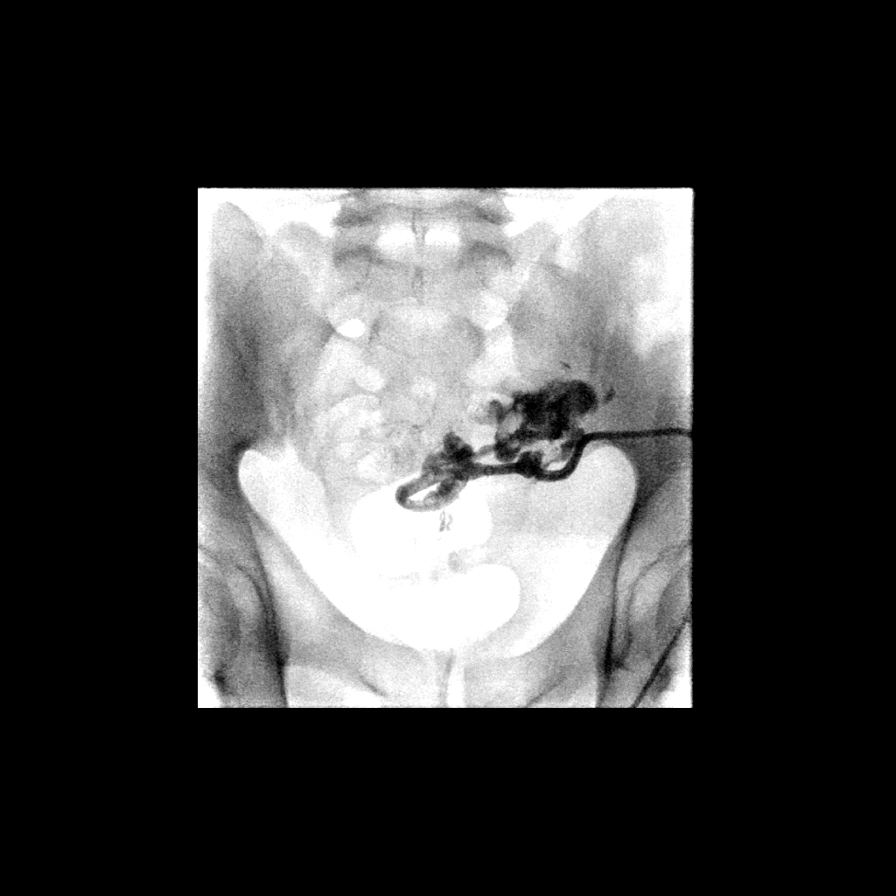
[frame 15/17]
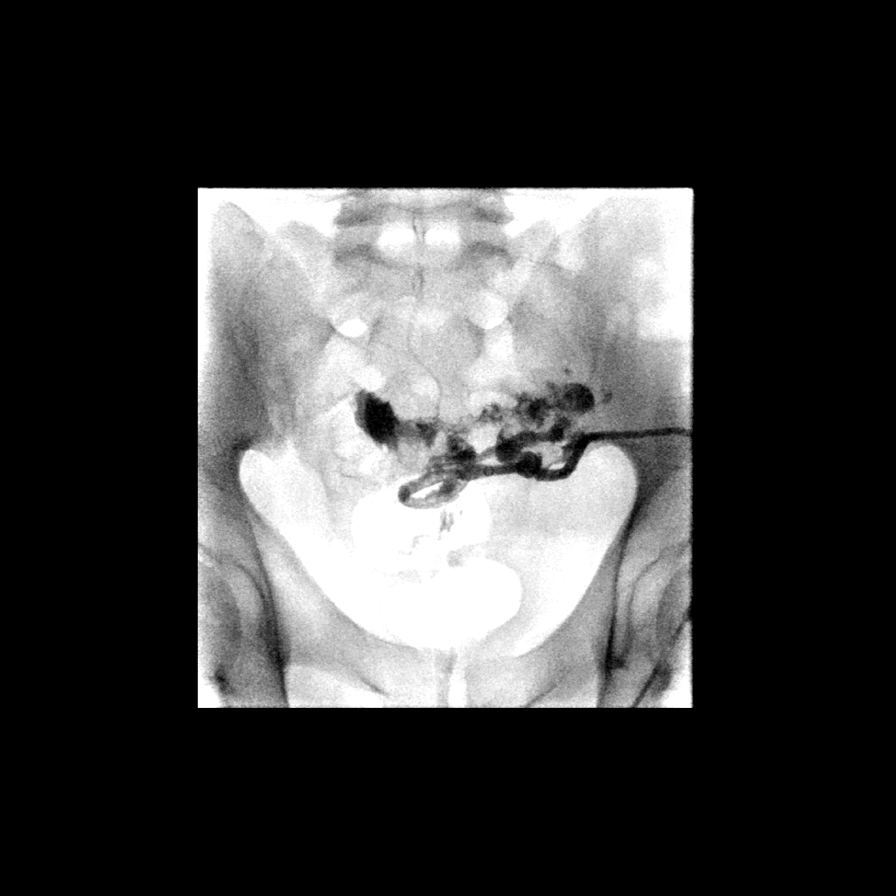

[Series 3: one shot · 1 of 1 slices shown (2 of 2)]
[im 1/1]
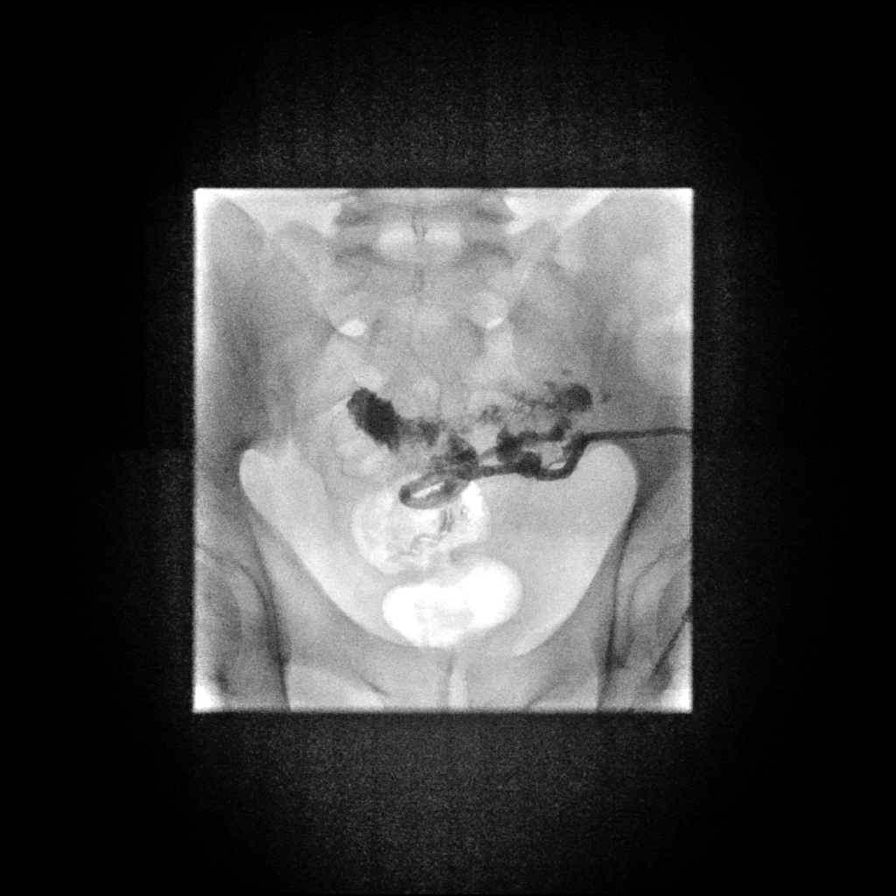

[7 of 7 positions shown; findings below may reference images not displayed]

EXAM:
RIGHT PELVIC ABSCESS DRAIN INJECTION

MEDICATIONS:
The patient is currently admitted to the hospital and receiving
intravenous antibiotics. The antibiotics were administered within an
appropriate time frame prior to the initiation of the procedure.

ANESTHESIA/SEDATION:
None

COMPLICATIONS:
None immediate.

PROCEDURE:
Informed written consent was obtained from the patient after a
thorough discussion of the procedural risks, benefits and
alternatives. All questions were addressed. Maximal Sterile Barrier
Technique was utilized including caps, mask, sterile gowns, sterile
gloves, sterile drape, hand hygiene and skin antiseptic. A timeout
was performed prior to the initiation of the procedure.

Contrast was injected into the right trans gluteal abscess drain and
imaging was obtained.
FINDINGS: Contrast exits the drain catheter and immediately fills adjacent
sigmoid colon. There is no abscess cavity.
IMPRESSION: Abscess drain injection confirms a persistent fistula to adjacent
sigmoid colon.

## 2019-03-01 IMAGING — RF DG SINUS / FISTULA TRACT / ABSCESSOGRAM
2 series · 5 of 5 positions shown · non-contrast
Comparison: 10/29/2016

CLINICAL DATA: STABLE DIVERTICULITIS, ABSCESS, RT AND STRAIN
FOLLOW-UP

EXAM:
ABSCESS INJECTION

[Series 1: one shot · 1 of 1 slices shown]
[im 1/1]
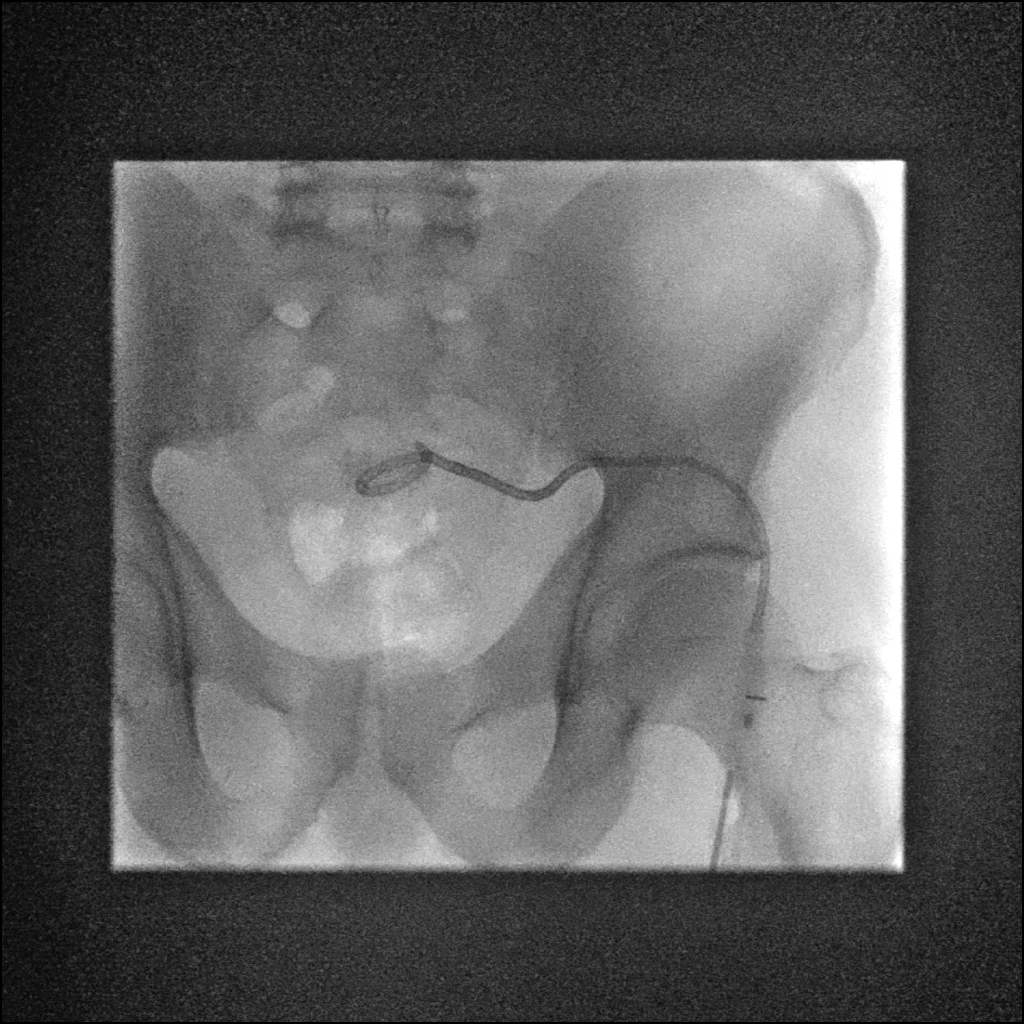

[Series 2: sequence · 4 of 61 frames shown]
[frame 2/61]
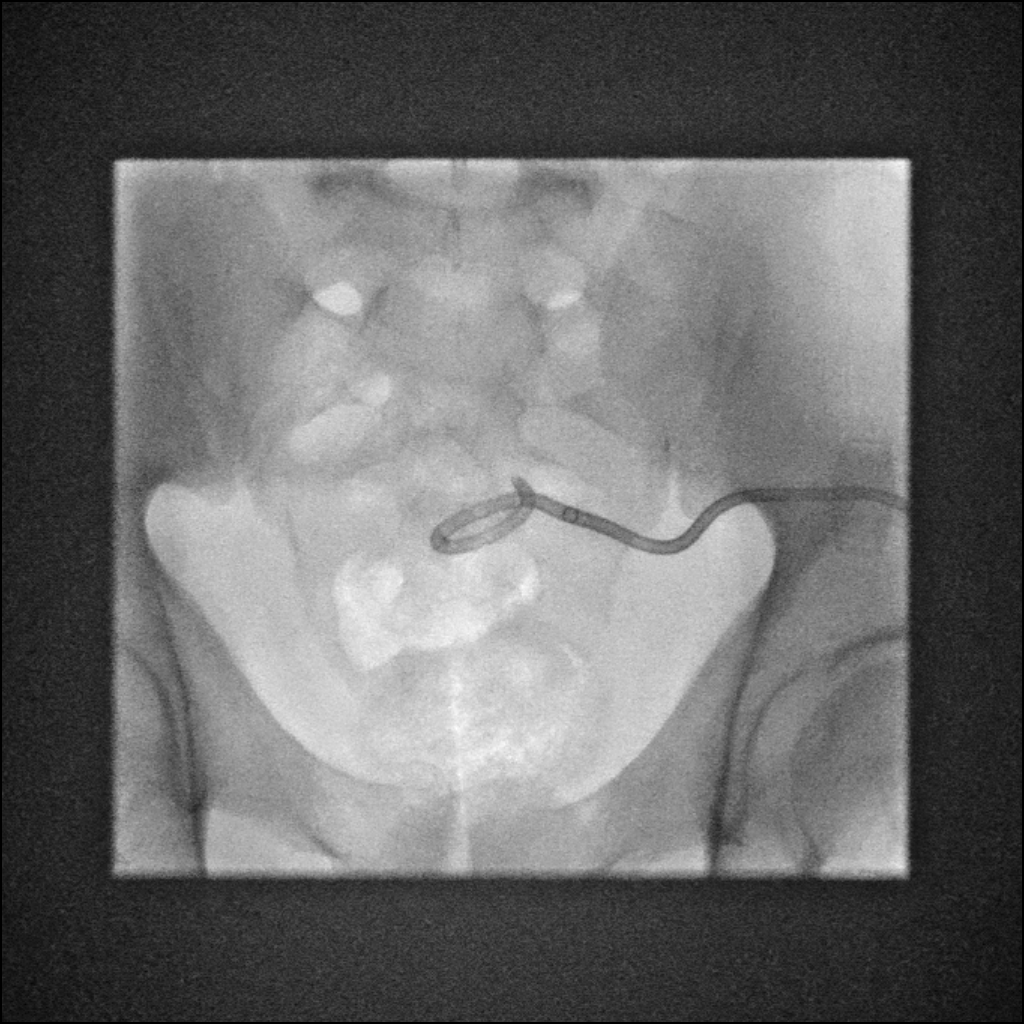
[frame 10/61]
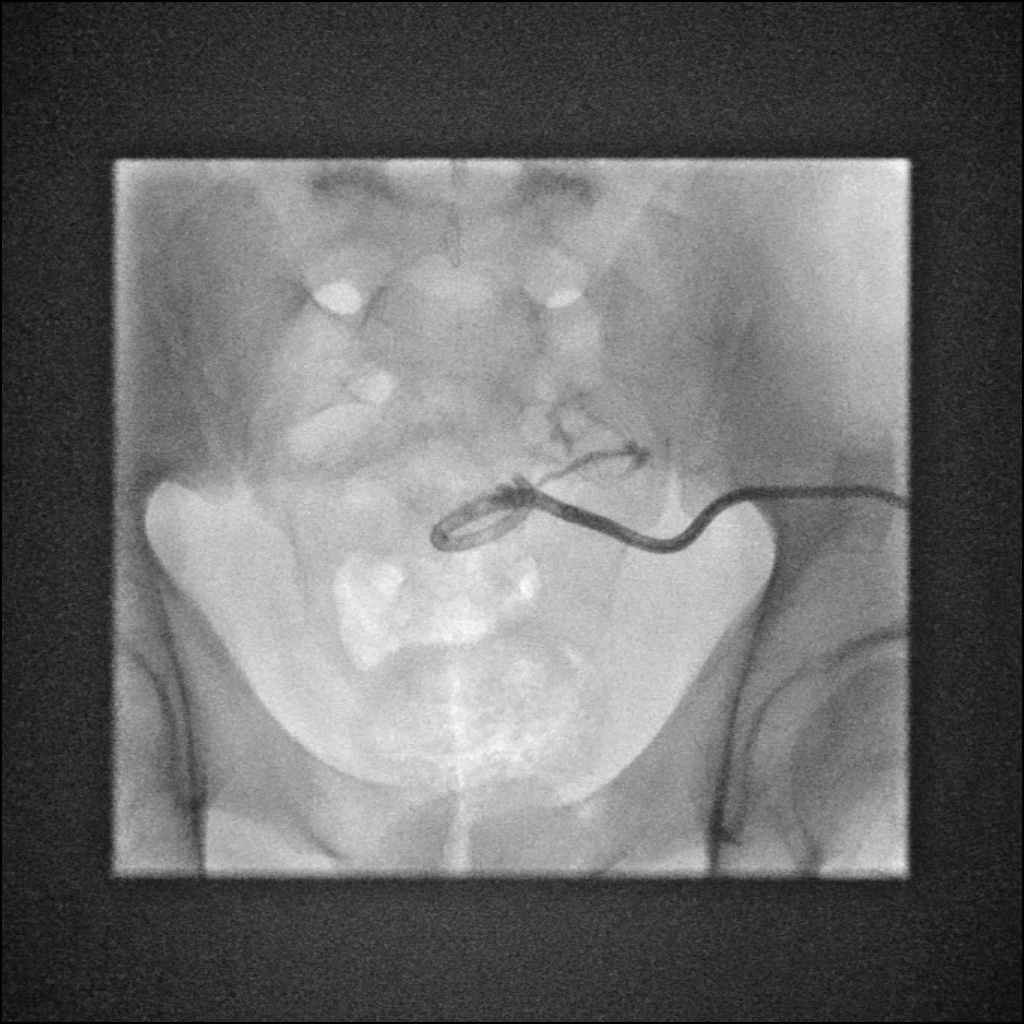
[frame 31/61]
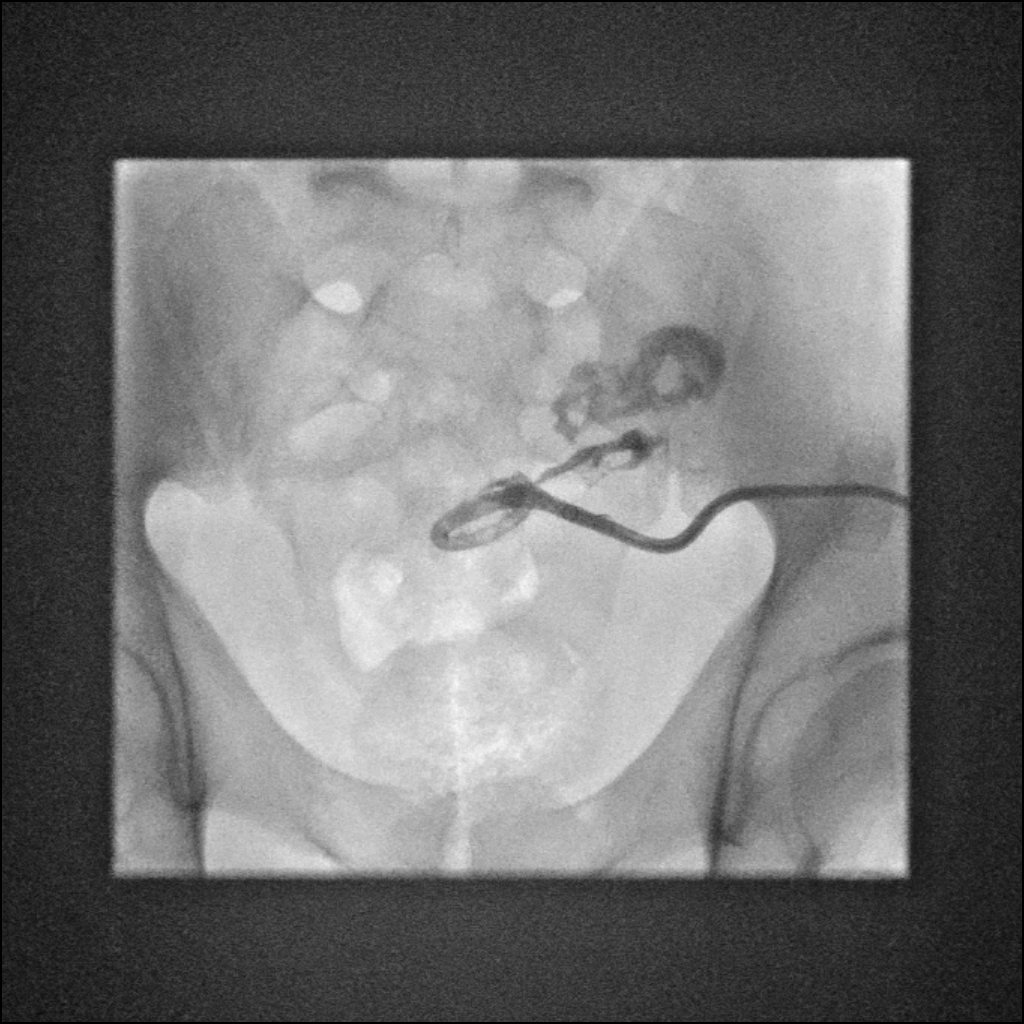
[frame 52/61]
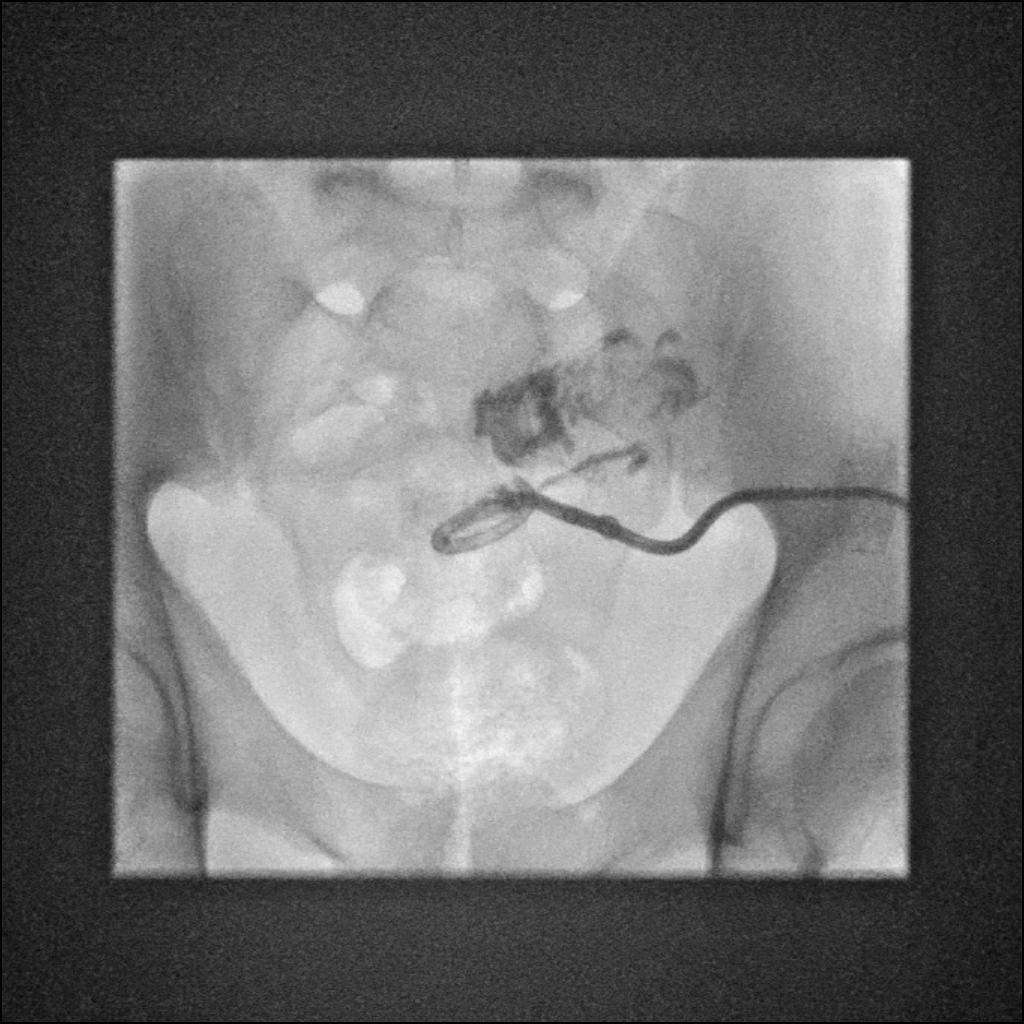

[5 of 5 positions shown; findings below may reference images not displayed]

FINDINGS: Contrast injection of the existing pelvic abscess drain performed
under fluoroscopy.

There is a persistent but smaller fistula tract from the collapsed
abscess cavity to the sigmoid colon. Abscess cavity has resolved.
IMPRESSION: Persistent but smaller fistula tract from the collapsed resolved
abscess cavity to the sigmoid colon.

## 2019-03-14 ENCOUNTER — Telehealth: Payer: Self-pay | Admitting: Family Medicine

## 2019-03-14 DIAGNOSIS — I10 Essential (primary) hypertension: Secondary | ICD-10-CM

## 2019-03-14 MED ORDER — LOSARTAN POTASSIUM 25 MG PO TABS: 25.0000 mg | ORAL_TABLET | Freq: Every day | ORAL | 0 refills | Status: DC

## 2019-03-14 NOTE — Telephone Encounter (Signed)
Rx sent 

## 2019-03-14 NOTE — Telephone Encounter (Signed)
1) Medication(s) Requested (by name): -losartan (COZAAR) 25 MG tablet   2) Pharmacy of Choice: -[facility] [address] [place], [place] - [address]

## 2019-03-16 ENCOUNTER — Other Ambulatory Visit: Payer: Self-pay

## 2019-03-16 ENCOUNTER — Ambulatory Visit (HOSPITAL_BASED_OUTPATIENT_CLINIC_OR_DEPARTMENT_OTHER): Payer: Self-pay | Admitting: Family Medicine

## 2019-03-16 VITALS — BP 120/76 | HR 76 | Temp 97.5°F | Resp 16 | Wt 233.0 lb

## 2019-03-16 DIAGNOSIS — Z5321 Procedure and treatment not carried out due to patient leaving prior to being seen by health care provider: Secondary | ICD-10-CM

## 2019-03-16 NOTE — Progress Notes (Signed)
  F/u blood in stool Has not been able to f/u with GI specialist.  Notices blood occassional  Has abd. Pain. 6/10 intermittent Nausea in the a.m.  Denies vomiting  Has different types of stool:  Flat and diarrhea

## 2019-04-20 ENCOUNTER — Telehealth: Payer: Self-pay | Admitting: Family Medicine

## 2019-04-20 DIAGNOSIS — I10 Essential (primary) hypertension: Secondary | ICD-10-CM

## 2019-04-20 MED ORDER — LOSARTAN POTASSIUM 25 MG PO TABS: 25.0000 mg | ORAL_TABLET | Freq: Every day | ORAL | 0 refills | Status: DC

## 2019-04-20 NOTE — Telephone Encounter (Signed)
Patient called and requested for listed medication to be refilled and sent to [facility] [address] . losartan (COZAAR) 25 MG tablet [[id number]]

## 2019-04-20 NOTE — Telephone Encounter (Signed)
Sent!

## 2019-05-18 ENCOUNTER — Telehealth: Payer: Self-pay | Admitting: Family Medicine

## 2019-05-18 NOTE — Telephone Encounter (Signed)
Patient called in and requested for a refill on listed medication. Patient stated that he was currently out of town for work and stated he would make an appointment when he comes back in town with pcp.  losartan (COZAAR) 25 MG tablet [[id number]]

## 2019-05-19 ENCOUNTER — Other Ambulatory Visit: Payer: Self-pay | Admitting: Pharmacist

## 2019-05-19 ENCOUNTER — Other Ambulatory Visit: Payer: Self-pay | Admitting: Family Medicine

## 2019-05-19 DIAGNOSIS — I10 Essential (primary) hypertension: Secondary | ICD-10-CM

## 2019-05-19 MED ORDER — LOSARTAN POTASSIUM 25 MG PO TABS: 25.0000 mg | ORAL_TABLET | Freq: Every day | ORAL | 0 refills | Status: DC

## 2019-05-19 NOTE — Telephone Encounter (Signed)
30 day refill has been sent to [facility] pharmacy in [facility]

## 2019-05-23 NOTE — Telephone Encounter (Signed)
LMOM informing patient that the script he requested was sent to [facility] pharmacy in [place]

## 2019-06-22 ENCOUNTER — Other Ambulatory Visit: Payer: Self-pay | Admitting: Family Medicine

## 2019-06-22 DIAGNOSIS — I10 Essential (primary) hypertension: Secondary | ICD-10-CM

## 2019-06-22 DIAGNOSIS — I2510 Atherosclerotic heart disease of native coronary artery without angina pectoris: Secondary | ICD-10-CM

## 2019-06-22 NOTE — Telephone Encounter (Signed)
error 

## 2019-07-05 ENCOUNTER — Other Ambulatory Visit: Payer: Self-pay

## 2019-07-05 ENCOUNTER — Ambulatory Visit: Payer: Self-pay | Attending: Family Medicine

## 2019-07-05 NOTE — Progress Notes (Unsigned)
This encounter was created in error - please disregard.

## 2019-07-10 ENCOUNTER — Telehealth: Payer: Self-pay | Admitting: Family Medicine

## 2019-07-10 DIAGNOSIS — I10 Essential (primary) hypertension: Secondary | ICD-10-CM

## 2019-07-10 DIAGNOSIS — I2510 Atherosclerotic heart disease of native coronary artery without angina pectoris: Secondary | ICD-10-CM

## 2019-07-10 MED ORDER — CLOPIDOGREL BISULFATE 75 MG PO TABS: 75.0000 mg | ORAL_TABLET | Freq: Every day | ORAL | 0 refills | Status: DC

## 2019-07-10 MED ORDER — LOSARTAN POTASSIUM 25 MG PO TABS: 25.0000 mg | ORAL_TABLET | Freq: Every day | ORAL | 0 refills | Status: DC

## 2019-07-10 NOTE — Telephone Encounter (Signed)
Sent!

## 2019-07-10 NOTE — Telephone Encounter (Signed)
1) Medication(s) Requested (by name):  clopidogrel (PLAVIX) 75 MG tablet  losartan (COZAAR) 25 MG tablet    2) Pharmacy of Choice:[facility] [address] [place], [place] - [address]   3) Special Requests: Patient missed his appt and has rescheduled.    Approved medications will be sent to the pharmacy, we will reach out if there is an issue.  Requests made after 3pm may not be addressed until the following business day!  If a patient is unsure of the name of the medication(s) please note and ask patient to call back when they are able to provide all info, do not send to responsible party until all information is available!

## 2019-07-26 ENCOUNTER — Ambulatory Visit: Payer: Self-pay | Attending: Physician Assistant | Admitting: Physician Assistant

## 2019-07-26 ENCOUNTER — Encounter: Payer: Self-pay | Admitting: Physician Assistant

## 2019-07-26 ENCOUNTER — Other Ambulatory Visit: Payer: Self-pay

## 2019-07-26 VITALS — BP 131/87 | HR 64 | Resp 16 | Wt 241.6 lb

## 2019-07-26 DIAGNOSIS — I10 Essential (primary) hypertension: Secondary | ICD-10-CM

## 2019-07-26 DIAGNOSIS — Z131 Encounter for screening for diabetes mellitus: Secondary | ICD-10-CM

## 2019-07-26 DIAGNOSIS — I2510 Atherosclerotic heart disease of native coronary artery without angina pectoris: Secondary | ICD-10-CM

## 2019-07-26 DIAGNOSIS — E785 Hyperlipidemia, unspecified: Secondary | ICD-10-CM

## 2019-07-26 DIAGNOSIS — I252 Old myocardial infarction: Secondary | ICD-10-CM

## 2019-07-26 DIAGNOSIS — R5383 Other fatigue: Secondary | ICD-10-CM

## 2019-07-26 DIAGNOSIS — R198 Other specified symptoms and signs involving the digestive system and abdomen: Secondary | ICD-10-CM

## 2019-07-26 MED ORDER — LOSARTAN POTASSIUM 25 MG PO TABS: 25.0000 mg | ORAL_TABLET | Freq: Every day | ORAL | 1 refills | Status: DC

## 2019-07-26 MED ORDER — ATORVASTATIN CALCIUM 80 MG PO TABS: ORAL_TABLET | ORAL | 1 refills | Status: DC

## 2019-07-26 MED ORDER — CARVEDILOL 3.125 MG PO TABS: 3.1250 mg | ORAL_TABLET | Freq: Two times a day (BID) | ORAL | 1 refills | Status: DC

## 2019-07-26 MED ORDER — NITROGLYCERIN 0.4 MG SL SUBL: 0.4000 mg | SUBLINGUAL_TABLET | SUBLINGUAL | 2 refills | Status: DC | PRN

## 2019-07-26 MED ORDER — CLOPIDOGREL BISULFATE 75 MG PO TABS: 75.0000 mg | ORAL_TABLET | Freq: Every day | ORAL | 1 refills | Status: DC

## 2019-07-26 NOTE — Progress Notes (Signed)
Derrick Villa, is a 54 y.o. male  ERD:[id number]  HUD:[id number]  DOB - 03/11/1965  Subjective:  Chief Complaint and HPI: Derrick Villa is a 53 y.o. male here today for several issues and med RF.  Wants 90 day supply.  No CP.  H/o MI about 2 years ago.  Never saw cardiology in follow-up.    Concerned with fatigue and weight gain.  Says he has gained 40 pounds in 6 months.  (7 pounds since March 2021 according to Epic).   Also says he never saw GI which was recommended at the last visit.  He has noticed that he is constipated sometimes and has diarrhea and "weird" shaped stools.  Occasionally has BRBPR after constipation but no overt melena or hematochezia.     ROS:   Constitutional:  No f/c, No night sweats, No unexplained weight loss. EENT:  No vision changes, No blurry vision, No hearing changes. No mouth, throat, or ear problems.  Respiratory: No cough, No SOB Cardiac: No CP, no palpitations GI:  No abd pain, No N/V/D.  +change in BM GU: No Urinary s/sx Musculoskeletal: No joint pain Neuro: No headache, no dizziness, no motor weakness.  Skin: No rash Endocrine:  No polydipsia. No polyuria.  Psych: Denies SI/HI  No problems updated.  ALLERGIES: No Known Allergies  PAST MEDICAL HISTORY: Past Medical History:  Diagnosis Date  . CAD (coronary artery disease), native coronary artery   . Diverticulitis 09/2016  . Hyperlipidemia   . Hypertension   . NSTEMI (non-ST elevated myocardial infarction) [facility])     MEDICATIONS AT HOME: Prior to Admission medications   Medication Sig Start Date End Date Taking? Authorizing Provider  aspirin 81 MG chewable tablet Chew 1 tablet (81 mg total) by mouth daily. 09/08/17   Fulp, Cammie, MD  atorvastatin (LIPITOR) 80 MG tablet Take one tablet once per day in the evenings to lower cholesterol 07/26/19   Georgian Co M, PA-C  carvedilol (COREG) 3.125 MG tablet Take 1 tablet (3.125 mg total) by mouth 2 (two) times daily. 07/26/19   Anders Simmonds, PA-C  clopidogrel (PLAVIX) 75 MG tablet Take 1 tablet (75 mg total) by mouth daily. 07/26/19   Anders Simmonds, PA-C  losartan (COZAAR) 25 MG tablet Take 1 tablet (25 mg total) by mouth daily. 07/26/19   Anders Simmonds, PA-C  nitroGLYCERIN (NITROSTAT) 0.4 MG SL tablet Place 1 tablet (0.4 mg total) under the tongue every 5 (five) minutes x 3 doses as needed for chest pain. 07/26/19   Anders Simmonds, PA-C     Objective:  EXAM:   Vitals:   07/26/19 1035  BP: 131/87  Pulse: 64  Resp: 16  SpO2: 95%  Weight: 241 lb 9.6 oz (109.6 kg)    General appearance : A&OX3. NAD. Non-toxic-appearing HEENT: Atraumatic and Normocephalic.  PERRLA. EOM intact.  Neck: supple, no JVD. No cervical lymphadenopathy. No thyromegaly Chest/Lungs:  Breathing-non-labored, Good air entry bilaterally, breath sounds normal without rales, rhonchi, or wheezing  CVS: S1 S2 regular, no murmurs, gallops, rubs  Extremities: Bilateral Lower Ext shows no edema, both legs are warm to touch with = pulse throughout Neurology:  CN II-XII grossly intact, Non focal.   Psych:  TP linear. J/I WNL. Normal speech. Appropriate eye contact and affect.  Skin:  No Rash  Data Review Lab Results  Component Value Date   HGBA1C 5.9 (H) 01/10/2019   HGBA1C 6.0 (H) 07/21/2017     Assessment & Plan  1. Coronary artery disease involving native coronary artery of native heart without angina pectoris Never had cardiology f/up.  No CP - clopidogrel (PLAVIX) 75 MG tablet; Take 1 tablet (75 mg total) by mouth daily.  Dispense: 90 tablet; Refill: 1 - carvedilol (COREG) 3.125 MG tablet; Take 1 tablet (3.125 mg total) by mouth 2 (two) times daily.  Dispense: 180 tablet; Refill: 1 - atorvastatin (LIPITOR) 80 MG tablet; Take one tablet once per day in the evenings to lower cholesterol  Dispense: 90 tablet; Refill: 1 - nitroGLYCERIN (NITROSTAT) 0.4 MG SL tablet; Place 1 tablet (0.4 mg total) under the tongue every 5 (five) minutes x  3 doses as needed for chest pain.  Dispense: 25 tablet; Refill: 2 - Ambulatory referral to Cardiology - Comprehensive metabolic panel  2. Essential hypertension suboptimal but controlled.  Should work on diet/exercise - carvedilol (COREG) 3.125 MG tablet; Take 1 tablet (3.125 mg total) by mouth 2 (two) times daily.  Dispense: 180 tablet; Refill: 1 - losartan (COZAAR) 25 MG tablet; Take 1 tablet (25 mg total) by mouth daily.  Dispense: 90 tablet; Refill: 1 - Ambulatory referral to Cardiology - Comprehensive metabolic panel  3. Hyperlipidemia LDL goal <70 - atorvastatin (LIPITOR) 80 MG tablet; Take one tablet once per day in the evenings to lower cholesterol  Dispense: 90 tablet; Refill: 1 - Ambulatory referral to Cardiology - Comprehensive metabolic panel - Lipid panel  4. H/O acute myocardial infarction - Ambulatory referral to Cardiology  5. Other fatigue and weight gain-check TSH  6. Change in bowel function - Ambulatory referral to Gastroenterology - CBC with Differential/Platelet  7. Screening for diabetes mellitus - Hemoglobin A1c   Patient have been counseled extensively about nutrition and exercise  Return in about 4 months (around 11/26/2019) for PCP;  chronic conditions.  The patient was given clear instructions to go to ER or return to medical center if symptoms don't improve, worsen or new problems develop. The patient verbalized understanding. The patient was told to call to get lab results if they haven't heard anything in the next week.     Georgian Co, PA-C [facility] [place], [place] [phone]   07/26/2019, 10:55 AMPatient ID: Derrick Villa, male   DOB: 1965/11/27, 54 y.o.   MRN: [id number]

## 2019-07-27 ENCOUNTER — Other Ambulatory Visit: Payer: Self-pay | Admitting: Physician Assistant

## 2019-07-27 LAB — CBC WITH DIFFERENTIAL/PLATELET
Basophils Absolute: 0.1 10*3/uL (ref 0.0–0.2)
Basos: 1 %
EOS (ABSOLUTE): 0.2 10*3/uL (ref 0.0–0.4)
Eos: 3 %
Hematocrit: 41.2 % (ref 37.5–51.0)
Hemoglobin: 14.2 g/dL (ref 13.0–17.7)
Immature Grans (Abs): 0.1 10*3/uL (ref 0.0–0.1)
Immature Granulocytes: 1 %
Lymphocytes Absolute: 2.3 10*3/uL (ref 0.7–3.1)
Lymphs: 32 %
MCH: 30.1 pg (ref 26.6–33.0)
MCHC: 34.5 g/dL (ref 31.5–35.7)
MCV: 87 fL (ref 79–97)
Monocytes Absolute: 0.6 10*3/uL (ref 0.1–0.9)
Monocytes: 8 %
Neutrophils Absolute: 4 10*3/uL (ref 1.4–7.0)
Neutrophils: 55 %
Platelets: 236 10*3/uL (ref 150–450)
RBC: 4.72 x10E6/uL (ref 4.14–5.80)
RDW: 12.8 % (ref 11.6–15.4)
WBC: 7.3 10*3/uL (ref 3.4–10.8)

## 2019-07-27 LAB — COMPREHENSIVE METABOLIC PANEL
ALT: 38 IU/L (ref 0–44)
AST: 22 IU/L (ref 0–40)
Albumin/Globulin Ratio: 1.6 (ref 1.2–2.2)
Albumin: 4.6 g/dL (ref 3.8–4.9)
Alkaline Phosphatase: 115 IU/L (ref 48–121)
BUN/Creatinine Ratio: 14 (ref 9–20)
BUN: 12 mg/dL (ref 6–24)
Bilirubin Total: 0.2 mg/dL (ref 0.0–1.2)
CO2: 20 mmol/L (ref 20–29)
Calcium: 9.5 mg/dL (ref 8.7–10.2)
Chloride: 105 mmol/L (ref 96–106)
Creatinine, Ser: 0.86 mg/dL (ref 0.76–1.27)
GFR calc Af Amer: 114 mL/min/{1.73_m2} (ref >59)
GFR calc non Af Amer: 99 mL/min/{1.73_m2} (ref >59)
Globulin, Total: 2.9 g/dL (ref 1.5–4.5)
Glucose: 108 mg/dL — ABNORMAL HIGH (ref 65–99)
Potassium: 4.5 mmol/L (ref 3.5–5.2)
Sodium: 141 mmol/L (ref 134–144)
Total Protein: 7.5 g/dL (ref 6.0–8.5)

## 2019-07-27 LAB — LIPID PANEL
Chol/HDL Ratio: 4 ratio (ref 0.0–5.0)
Cholesterol, Total: 166 mg/dL (ref 100–199)
HDL: 41 mg/dL (ref >39)
LDL Chol Calc (NIH): 91 mg/dL (ref 0–99)
Triglycerides: 201 mg/dL — ABNORMAL HIGH (ref 0–149)
VLDL Cholesterol Cal: 34 mg/dL (ref 5–40)

## 2019-07-27 LAB — HEMOGLOBIN A1C
Est. average glucose Bld gHb Est-mCnc: 128 mg/dL
Hgb A1c MFr Bld: 6.1 % — ABNORMAL HIGH (ref 4.8–5.6)

## 2019-07-27 LAB — TSH: TSH: 3.14 u[IU]/mL (ref 0.450–4.500)

## 2019-07-27 MED ORDER — METFORMIN HCL 500 MG PO TABS
500.0000 mg | ORAL_TABLET | Freq: Two times a day (BID) | ORAL | 3 refills | Status: DC
Start: 2019-07-27 — End: 2020-05-02

## 2019-08-11 ENCOUNTER — Encounter: Payer: Self-pay | Admitting: General Practice

## 2020-02-23 ENCOUNTER — Other Ambulatory Visit: Payer: Self-pay | Admitting: Physician Assistant

## 2020-02-23 DIAGNOSIS — I10 Essential (primary) hypertension: Secondary | ICD-10-CM

## 2020-02-23 DIAGNOSIS — I2510 Atherosclerotic heart disease of native coronary artery without angina pectoris: Secondary | ICD-10-CM

## 2020-02-23 DIAGNOSIS — E785 Hyperlipidemia, unspecified: Secondary | ICD-10-CM

## 2020-02-23 NOTE — Telephone Encounter (Signed)
Pt called in to follow up on refill request. Pt says that he was told by his pharmacy to also call in.    Pharmacy:  [facility] [address] [place] - [address] Phone:  [phone]  Fax:  [phone]

## 2020-03-28 ENCOUNTER — Other Ambulatory Visit: Payer: Self-pay | Admitting: Physician Assistant

## 2020-03-28 ENCOUNTER — Other Ambulatory Visit: Payer: Self-pay | Admitting: Family Medicine

## 2020-03-28 DIAGNOSIS — I2510 Atherosclerotic heart disease of native coronary artery without angina pectoris: Secondary | ICD-10-CM

## 2020-03-28 DIAGNOSIS — I10 Essential (primary) hypertension: Secondary | ICD-10-CM

## 2020-03-28 DIAGNOSIS — E785 Hyperlipidemia, unspecified: Secondary | ICD-10-CM

## 2020-03-28 MED ORDER — LOSARTAN POTASSIUM 25 MG PO TABS: 25.0000 mg | ORAL_TABLET | Freq: Every day | ORAL | 0 refills | Status: DC

## 2020-03-28 MED ORDER — ATORVASTATIN CALCIUM 80 MG PO TABS: ORAL_TABLET | ORAL | 0 refills | Status: DC

## 2020-03-28 NOTE — Telephone Encounter (Signed)
Medication Refill - Medication: atorvastatin (LIPITOR) 80 MG tablet losartan (COZAAR) 25 MG tablet     Preferred Pharmacy (with phone number or street name):  [facility] [address] [place], [place] - [address] Phone:  [phone]  Fax:  [phone]       Agent: Please be advised that RX refills may take up to 3 business days. We ask that you follow-up with your pharmacy.

## 2020-03-28 NOTE — Telephone Encounter (Signed)
Requested medication (s) are due for refill today: yes  Requested medication (s) are on the active medication list: yes   Future visit scheduled: yes  Notes to clinic: Patient is requesting a 90 day supply until appointment on 05/02/2020 Review for courtesy supply   Requested Prescriptions  Pending Prescriptions Disp Refills   atorvastatin (LIPITOR) 80 MG tablet 30 tablet 0    Sig: TAKE 1 TABLET BY MOUTH ONCE DAILY IN THE EVENING TO  LOWER  CHOLESTEROL      Cardiovascular:  Antilipid - Statins Failed - 03/28/2020 10:53 AM      Failed - LDL in normal range and within 360 days    LDL Chol Calc (NIH)  Date Value Ref Range Status  07/26/2019 91 0 - 99 mg/dL Final          Failed - Triglycerides in normal range and within 360 days    Triglycerides  Date Value Ref Range Status  07/26/2019 201 (H) 0 - 149 mg/dL Final          Passed - Total Cholesterol in normal range and within 360 days    Cholesterol, Total  Date Value Ref Range Status  07/26/2019 166 100 - 199 mg/dL Final          Passed - HDL in normal range and within 360 days    HDL  Date Value Ref Range Status  07/26/2019 41 >39 mg/dL Final          Passed - Patient is not pregnant      Passed - Valid encounter within last 12 months    Recent Outpatient Visits           8 months ago H/O acute myocardial infarction   [facility] [place], [place], [place]   8 months ago    [facility]   1 year ago Patient left without being seen   [facility] [place], [place], [place]   1 year ago Blood in stool   [facility] [place], [place], [place]   1 year ago Pain of upper abdomen   [facility] [place], [place], [place]       Future Appointments             In 1 month [place], Marzella Schlein, PA-C [facility]               losartan (COZAAR) 25 MG tablet 30  tablet 0    Sig: Take 1 tablet (25 mg total) by mouth daily.      Cardiovascular:  Angiotensin Receptor Blockers Failed - 03/28/2020 10:53 AM      Failed - Cr in normal range and within 180 days    Creatinine, Ser  Date Value Ref Range Status  07/26/2019 0.86 0.76 - 1.27 mg/dL Final          Failed - K in normal range and within 180 days    Potassium  Date Value Ref Range Status  07/26/2019 4.5 3.5 - 5.2 mmol/L Final          Failed - Valid encounter within last 6 months    Recent Outpatient Visits           8 months ago H/O acute myocardial infarction   [facility] [place], [place], [place]   8 months ago    [facility]   1 year ago Patient left without being seen   [facility] [place], [place], [place]   1 year ago Blood in stool   [facility] [place], [place], [place]   1 year ago Pain of upper abdomen   [facility] [place], Marzella Schlein, [place]       Future Appointments             In 1 month Anders Simmonds, [place] [facility]             Passed - Patient is not pregnant      Passed - Last BP in normal range    BP Readings from Last 1 Encounters:  07/26/19 131/87

## 2020-04-12 ENCOUNTER — Other Ambulatory Visit: Payer: Self-pay | Admitting: Physician Assistant

## 2020-04-12 DIAGNOSIS — I2510 Atherosclerotic heart disease of native coronary artery without angina pectoris: Secondary | ICD-10-CM

## 2020-04-12 NOTE — Telephone Encounter (Signed)
   Notes to clinic:  Patient upcoming appointment on 05/02/2020 Courtesy refill already given  Review for another refill   Requested Prescriptions  Pending Prescriptions Disp Refills   clopidogrel (PLAVIX) 75 MG tablet [Pharmacy Med Name: Clopidogrel Bisulfate 75 MG Oral Tablet] 30 tablet 0    Sig: TAKE 1 TABLET BY MOUTH ONCE DAILY *APPOINTMENT  NEEDED  FOR  FURTHER  REFILLS*      Hematology: Antiplatelets - clopidogrel Failed - 04/12/2020 11:30 AM      Failed - Evaluate AST, ALT within 2 months of therapy initiation.      Failed - HCT in normal range and within 180 days    Hematocrit  Date Value Ref Range Status  07/26/2019 41.2 37.5 - 51.0 % Final          Failed - HGB in normal range and within 180 days    Hemoglobin  Date Value Ref Range Status  07/26/2019 14.2 13.0 - 17.7 g/dL Final          Failed - PLT in normal range and within 180 days    Platelets  Date Value Ref Range Status  07/26/2019 236 150 - 450 x10E3/uL Final          Failed - Valid encounter within last 6 months    Recent Outpatient Visits           8 months ago H/O acute myocardial infarction   [facility] [place], [place], [place]   9 months ago    [facility]   1 year ago Patient left without being seen   [facility] [place], [place], [place]   1 year ago Blood in stool   [facility] [place], [place], [place]   2 years ago Pain of upper abdomen   [facility] [place], [place], [place]       Future Appointments             In 2 weeks Cal[place]oun, Marzella Schlein, PA-C [facility]             Passed - ALT in normal range and within 360 days    ALT  Date Value Ref Range Status  07/26/2019 38 0 - 44 IU/L Final          Passed - AST in normal range and within 360 days    AST  Date Value Ref Range Status  07/26/2019 22 0 - 40 IU/L Final

## 2020-05-02 ENCOUNTER — Encounter (INDEPENDENT_AMBULATORY_CARE_PROVIDER_SITE_OTHER): Payer: Self-pay

## 2020-05-02 ENCOUNTER — Other Ambulatory Visit: Payer: Self-pay

## 2020-05-02 ENCOUNTER — Ambulatory Visit: Payer: Self-pay | Attending: Physician Assistant | Admitting: Physician Assistant

## 2020-05-02 ENCOUNTER — Encounter: Payer: Self-pay | Admitting: Physician Assistant

## 2020-05-02 VITALS — BP 151/83 | HR 98 | Ht 71.26 in | Wt 236.0 lb

## 2020-05-02 DIAGNOSIS — E785 Hyperlipidemia, unspecified: Secondary | ICD-10-CM

## 2020-05-02 DIAGNOSIS — I252 Old myocardial infarction: Secondary | ICD-10-CM

## 2020-05-02 DIAGNOSIS — I2102 ST elevation (STEMI) myocardial infarction involving left anterior descending coronary artery: Secondary | ICD-10-CM

## 2020-05-02 DIAGNOSIS — Z1211 Encounter for screening for malignant neoplasm of colon: Secondary | ICD-10-CM

## 2020-05-02 DIAGNOSIS — I10 Essential (primary) hypertension: Secondary | ICD-10-CM

## 2020-05-02 DIAGNOSIS — I2510 Atherosclerotic heart disease of native coronary artery without angina pectoris: Secondary | ICD-10-CM

## 2020-05-02 DIAGNOSIS — R7303 Prediabetes: Secondary | ICD-10-CM

## 2020-05-02 DIAGNOSIS — E1165 Type 2 diabetes mellitus with hyperglycemia: Secondary | ICD-10-CM

## 2020-05-02 LAB — POCT GLYCOSYLATED HEMOGLOBIN (HGB A1C): Hemoglobin A1C: 5.6 % (ref 4.0–5.6)

## 2020-05-02 LAB — GLUCOSE, POCT (MANUAL RESULT ENTRY): POC Glucose: 110 mg/dl — AB (ref 70–99)

## 2020-05-02 MED ORDER — CARVEDILOL 3.125 MG PO TABS: 3.1250 mg | ORAL_TABLET | Freq: Two times a day (BID) | ORAL | 1 refills | Status: DC

## 2020-05-02 MED ORDER — NITROGLYCERIN 0.4 MG SL SUBL: 0.4000 mg | SUBLINGUAL_TABLET | SUBLINGUAL | 2 refills | Status: DC | PRN

## 2020-05-02 MED ORDER — CLOPIDOGREL BISULFATE 75 MG PO TABS: 1.0000 | ORAL_TABLET | Freq: Every day | ORAL | 3 refills | Status: DC

## 2020-05-02 MED ORDER — ATORVASTATIN CALCIUM 80 MG PO TABS: ORAL_TABLET | ORAL | 0 refills | Status: DC

## 2020-05-02 MED ORDER — METFORMIN HCL 500 MG PO TABS: 500.0000 mg | ORAL_TABLET | Freq: Two times a day (BID) | ORAL | 3 refills | Status: DC

## 2020-05-02 MED ORDER — LOSARTAN POTASSIUM 25 MG PO TABS: 25.0000 mg | ORAL_TABLET | Freq: Every day | ORAL | 3 refills | Status: DC

## 2020-05-02 NOTE — Progress Notes (Signed)
Follow up.

## 2020-05-02 NOTE — Progress Notes (Signed)
Patient ID: Derrick Villa, male   DOB: 30-Jul-1965, 55 y.o.   MRN: [id number]   Mubashir Mallek, is a 55 y.o. male  QQV:[id number]  PPI:[id number]  DOB - Oct 04, 1965  Subjective:  Chief Complaint and HPI: Derrick Villa is a 55 y.o. male here today for check up and med RF.  Out of cholesterol meds.  Doing well.  No CP/SOB/dizziness.    ROS:   Constitutional:  No f/c, No night sweats, No unexplained weight loss. EENT:  No vision changes, No blurry vision, No hearing changes. No mouth, throat, or ear problems.  Respiratory: No cough, No SOB Cardiac: No CP, no palpitations GI:  No abd pain, No N/V/D. GU: No Urinary s/sx Musculoskeletal: No joint pain Neuro: No headache, no dizziness, no motor weakness.  Skin: No rash Endocrine:  No polydipsia. No polyuria.  Psych: Denies SI/HI  No problems updated.  ALLERGIES: No Known Allergies  PAST MEDICAL HISTORY: Past Medical History:  Diagnosis Date  . CAD (coronary artery disease), native coronary artery   . Diverticulitis 09/2016  . Hyperlipidemia   . Hypertension   . NSTEMI (non-ST elevated myocardial infarction) [facility])     MEDICATIONS AT HOME: Prior to Admission medications   Medication Sig Start Date End Date Taking? Authorizing Provider  aspirin 81 MG chewable tablet Chew 1 tablet (81 mg total) by mouth daily. 09/08/17  Yes Fulp, Cammie, MD  atorvastatin (LIPITOR) 80 MG tablet TAKE 1 TABLET BY MOUTH ONCE DAILY IN THE EVENING TO  LOWER  CHOLESTEROL 05/02/20   Anders Simmonds, PA-C  carvedilol (COREG) 3.125 MG tablet Take 1 tablet (3.125 mg total) by mouth 2 (two) times daily. 05/02/20   Anders Simmonds, PA-C  clopidogrel (PLAVIX) 75 MG tablet Take 1 tablet (75 mg total) by mouth daily. 05/02/20   Anders Simmonds, PA-C  losartan (COZAAR) 25 MG tablet Take 1 tablet (25 mg total) by mouth daily. 05/02/20   Anders Simmonds, PA-C  metFORMIN (GLUCOPHAGE) 500 MG tablet Take 1 tablet (500 mg total) by mouth 2 (two) times daily with a meal.  05/02/20   Ron Beske, Marzella Schlein, PA-C  nitroGLYCERIN (NITROSTAT) 0.4 MG SL tablet Place 1 tablet (0.4 mg total) under the tongue every 5 (five) minutes x 3 doses as needed for chest pain. 05/02/20   Anders Simmonds, PA-C     Objective:  EXAM:   Vitals:   05/02/20 1555  BP: (!) 151/83  Pulse: 98  SpO2: 96%  Weight: 236 lb (107 kg)  Height: 5' 11.26" (1.81 m)    General appearance : A&OX3. NAD. Non-toxic-appearing HEENT: Atraumatic and Normocephalic.  PERRLA. EOM intact.   Chest/Lungs:  Breathing-non-labored, Good air entry bilaterally, breath sounds normal without rales, rhonchi, or wheezing  CVS: S1 S2 regular, no murmurs, gallops, rubs  Extremities: Bilateral Lower Ext shows no edema, both legs are warm to touch with = pulse throughout Neurology:  CN II-XII grossly intact, Non focal.   Psych:  TP linear. J/I WNL. Normal speech. Appropriate eye contact and affect.  Skin:  No Rash  Data Review Lab Results  Component Value Date   HGBA1C 5.6 05/02/2020   HGBA1C 6.1 (H) 07/26/2019   HGBA1C 5.9 (H) 01/10/2019     Assessment & Plan   1. Prediabetes Controlled-continue regimen - Glucose (CBG) - POCT glycosylated hemoglobin (Hb A1C) - metFORMIN (GLUCOPHAGE) 500 MG tablet; Take 1 tablet (500 mg total) by mouth 2 (two) times daily with a meal.  Dispense: 180 tablet; Refill:  3 - Comprehensive metabolic panel  2. Essential hypertension Continue current regimen-he hasn't taken BP meds in 2-3 days - losartan (COZAAR) 25 MG tablet; Take 1 tablet (25 mg total) by mouth daily.  Dispense: 30 tablet; Refill: 3 - carvedilol (COREG) 3.125 MG tablet; Take 1 tablet (3.125 mg total) by mouth 2 (two) times daily.  Dispense: 180 tablet; Refill: 1 - Comprehensive metabolic panel - CBC with Differential/Platelet  3. Coronary artery disease involving native coronary artery of native heart without angina pectoris - clopidogrel (PLAVIX) 75 MG tablet; Take 1 tablet (75 mg total) by mouth daily.   Dispense: 30 tablet; Refill: 3 - carvedilol (COREG) 3.125 MG tablet; Take 1 tablet (3.125 mg total) by mouth 2 (two) times daily.  Dispense: 180 tablet; Refill: 1 - atorvastatin (LIPITOR) 80 MG tablet; TAKE 1 TABLET BY MOUTH ONCE DAILY IN THE EVENING TO  LOWER  CHOLESTEROL  Dispense: 30 tablet; Refill: 0 - nitroGLYCERIN (NITROSTAT) 0.4 MG SL tablet; Place 1 tablet (0.4 mg total) under the tongue every 5 (five) minutes x 3 doses as needed for chest pain.  Dispense: 25 tablet; Refill: 2  4. Hyperlipidemia LDL goal <70 - atorvastatin (LIPITOR) 80 MG tablet; TAKE 1 TABLET BY MOUTH ONCE DAILY IN THE EVENING TO  LOWER  CHOLESTEROL  Dispense: 30 tablet; Refill: 0 - Comprehensive metabolic panel - Lipid panel  5. H/O acute myocardial infarction -make appt with cardiologist-he verbalizes understanding - nitroGLYCERIN (NITROSTAT) 0.4 MG SL tablet; Place 1 tablet (0.4 mg total) under the tongue every 5 (five) minutes x 3 doses as needed for chest pain.  Dispense: 25 tablet; Refill: 2  6. STEMI involving left anterior descending coronary artery (HCC) -make appt with cardiologist-he verbalizes understanding - Ambulatory referral to Cardiology  7. Screening for colon cancer - Ambulatory referral to Gastroenterology     Patient have been counseled extensively about nutrition and exercise  Return in about 4 months (around 09/01/2020) for assign PCP f/up DM and htn.  The patient was given clear instructions to go to ER or return to medical center if symptoms don't improve, worsen or new problems develop. The patient verbalized understanding. The patient was told to call to get lab results if they haven't heard anything in the next week.     Georgian Co, PA-C [facility] [place], [place] [phone]   05/02/2020, 4:16 PM

## 2020-05-03 LAB — CBC WITH DIFFERENTIAL/PLATELET
Basophils Absolute: 0.1 10*3/uL (ref 0.0–0.2)
Basos: 1 %
EOS (ABSOLUTE): 0.2 10*3/uL (ref 0.0–0.4)
Eos: 2 %
Hematocrit: 45.1 % (ref 37.5–51.0)
Hemoglobin: 15.4 g/dL (ref 13.0–17.7)
Immature Grans (Abs): 0 10*3/uL (ref 0.0–0.1)
Immature Granulocytes: 1 %
Lymphocytes Absolute: 1.9 10*3/uL (ref 0.7–3.1)
Lymphs: 22 %
MCH: 29.6 pg (ref 26.6–33.0)
MCHC: 34.1 g/dL (ref 31.5–35.7)
MCV: 87 fL (ref 79–97)
Monocytes Absolute: 0.5 10*3/uL (ref 0.1–0.9)
Monocytes: 6 %
Neutrophils Absolute: 6 10*3/uL (ref 1.4–7.0)
Neutrophils: 68 %
Platelets: 267 10*3/uL (ref 150–450)
RBC: 5.21 x10E6/uL (ref 4.14–5.80)
RDW: 12.9 % (ref 11.6–15.4)
WBC: 8.7 10*3/uL (ref 3.4–10.8)

## 2020-05-03 LAB — LIPID PANEL
Chol/HDL Ratio: 4.5 ratio (ref 0.0–5.0)
Cholesterol, Total: 170 mg/dL (ref 100–199)
HDL: 38 mg/dL — ABNORMAL LOW (ref >39)
LDL Chol Calc (NIH): 105 mg/dL — ABNORMAL HIGH (ref 0–99)
Triglycerides: 154 mg/dL — ABNORMAL HIGH (ref 0–149)
VLDL Cholesterol Cal: 27 mg/dL (ref 5–40)

## 2020-05-03 LAB — COMPREHENSIVE METABOLIC PANEL
ALT: 50 IU/L — ABNORMAL HIGH (ref 0–44)
AST: 33 IU/L (ref 0–40)
Albumin/Globulin Ratio: 1.9 (ref 1.2–2.2)
Albumin: 4.7 g/dL (ref 3.8–4.9)
Alkaline Phosphatase: 112 IU/L (ref 44–121)
BUN/Creatinine Ratio: 13 (ref 9–20)
BUN: 10 mg/dL (ref 6–24)
Bilirubin Total: 0.5 mg/dL (ref 0.0–1.2)
CO2: 22 mmol/L (ref 20–29)
Calcium: 9.4 mg/dL (ref 8.7–10.2)
Chloride: 105 mmol/L (ref 96–106)
Creatinine, Ser: 0.76 mg/dL (ref 0.76–1.27)
Globulin, Total: 2.5 g/dL (ref 1.5–4.5)
Glucose: 95 mg/dL (ref 65–99)
Potassium: 4.2 mmol/L (ref 3.5–5.2)
Sodium: 141 mmol/L (ref 134–144)
Total Protein: 7.2 g/dL (ref 6.0–8.5)
eGFR: 107 mL/min/{1.73_m2} (ref >59)

## 2020-05-10 ENCOUNTER — Encounter: Payer: Self-pay | Admitting: *Deleted

## 2020-05-14 ENCOUNTER — Encounter: Payer: Self-pay | Admitting: *Deleted

## 2020-05-31 ENCOUNTER — Other Ambulatory Visit: Payer: Self-pay | Admitting: Physician Assistant

## 2020-05-31 DIAGNOSIS — E785 Hyperlipidemia, unspecified: Secondary | ICD-10-CM

## 2020-05-31 DIAGNOSIS — I2510 Atherosclerotic heart disease of native coronary artery without angina pectoris: Secondary | ICD-10-CM

## 2020-05-31 NOTE — Telephone Encounter (Signed)
Requested Prescriptions  Pending Prescriptions Disp Refills  . atorvastatin (LIPITOR) 80 MG tablet [Pharmacy Med Name: Atorvastatin Calcium 80 MG Oral Tablet] 30 tablet 2    Sig: TAKE 1 TABLET BY MOUTH ONCE DAILY IN  THE  EVENING  TO  LOWER  CHOLESTEROL     Cardiovascular:  Antilipid - Statins Failed - 05/31/2020  2:34 PM      Failed - LDL in normal range and within 360 days    LDL Chol Calc (NIH)  Date Value Ref Range Status  05/02/2020 105 (H) 0 - 99 mg/dL Final         Failed - HDL in normal range and within 360 days    HDL  Date Value Ref Range Status  05/02/2020 38 (L) >39 mg/dL Final         Failed - Triglycerides in normal range and within 360 days    Triglycerides  Date Value Ref Range Status  05/02/2020 154 (H) 0 - 149 mg/dL Final         Passed - Total Cholesterol in normal range and within 360 days    Cholesterol, Total  Date Value Ref Range Status  05/02/2020 170 100 - 199 mg/dL Final         Passed - Patient is not pregnant      Passed - Valid encounter within last 12 months    Recent Outpatient Visits          4 weeks ago Prediabetes   [facility] [place], [place], [place]   10 months ago H/O acute myocardial infarction   [facility] [place], [place], [place]   11 months ago    [facility]   1 year ago Patient left without being seen   [facility] [place], [place], [place]   1 year ago Blood in stool   [facility] [place], [place], [place]

## 2020-09-02 ENCOUNTER — Other Ambulatory Visit: Payer: Self-pay | Admitting: Physician Assistant

## 2020-09-02 DIAGNOSIS — I2510 Atherosclerotic heart disease of native coronary artery without angina pectoris: Secondary | ICD-10-CM

## 2020-09-02 DIAGNOSIS — E785 Hyperlipidemia, unspecified: Secondary | ICD-10-CM

## 2020-09-02 DIAGNOSIS — I10 Essential (primary) hypertension: Secondary | ICD-10-CM

## 2020-09-02 NOTE — Telephone Encounter (Signed)
Requested medications are due for refill today.  yes  Requested medications are on the active medications list.  yes  Last refill. 05/02/2020 & 05/31/20  Future visit scheduled.   no  Notes to clinic.  PCP listed is Fulp.

## 2020-10-23 ENCOUNTER — Telehealth: Payer: Self-pay | Admitting: Physician Assistant

## 2020-10-23 DIAGNOSIS — I2510 Atherosclerotic heart disease of native coronary artery without angina pectoris: Secondary | ICD-10-CM

## 2020-10-23 DIAGNOSIS — E785 Hyperlipidemia, unspecified: Secondary | ICD-10-CM

## 2020-10-23 DIAGNOSIS — I10 Essential (primary) hypertension: Secondary | ICD-10-CM

## 2020-10-24 NOTE — Telephone Encounter (Signed)
Requested medications are due for refill today.  yes  Requested medications are on the active medications list.  yes  Last refill. Both refilled 09/05/2020  Future visit scheduled.   no  Notes to clinic.  PCP listed as Dr.Cammie Fulp. Last seen in office 05/02/2020.

## 2020-10-25 NOTE — Telephone Encounter (Signed)
Pt scheduled an appt for 10/19, please advise if a short supply of denied medications can be sent in.

## 2020-10-30 ENCOUNTER — Other Ambulatory Visit: Payer: Self-pay

## 2020-10-30 ENCOUNTER — Ambulatory Visit: Payer: Self-pay | Attending: Physician Assistant | Admitting: Physician Assistant

## 2020-10-30 ENCOUNTER — Encounter: Payer: Self-pay | Admitting: Physician Assistant

## 2020-10-30 VITALS — BP 143/93 | HR 95 | Temp 98.5°F | Resp 18 | Ht 72.0 in | Wt 239.0 lb

## 2020-10-30 DIAGNOSIS — E785 Hyperlipidemia, unspecified: Secondary | ICD-10-CM

## 2020-10-30 DIAGNOSIS — I252 Old myocardial infarction: Secondary | ICD-10-CM

## 2020-10-30 DIAGNOSIS — E6609 Other obesity due to excess calories: Secondary | ICD-10-CM

## 2020-10-30 DIAGNOSIS — Z1159 Encounter for screening for other viral diseases: Secondary | ICD-10-CM

## 2020-10-30 DIAGNOSIS — R7303 Prediabetes: Secondary | ICD-10-CM

## 2020-10-30 DIAGNOSIS — I10 Essential (primary) hypertension: Secondary | ICD-10-CM

## 2020-10-30 DIAGNOSIS — Z6832 Body mass index (BMI) 32.0-32.9, adult: Secondary | ICD-10-CM

## 2020-10-30 DIAGNOSIS — Z125 Encounter for screening for malignant neoplasm of prostate: Secondary | ICD-10-CM

## 2020-10-30 DIAGNOSIS — I2510 Atherosclerotic heart disease of native coronary artery without angina pectoris: Secondary | ICD-10-CM

## 2020-10-30 LAB — POCT GLYCOSYLATED HEMOGLOBIN (HGB A1C): Hemoglobin A1C: 5.9 % — AB (ref 4.0–5.6)

## 2020-10-30 MED ORDER — ASPIRIN 81 MG PO CHEW: 81.0000 mg | CHEWABLE_TABLET | Freq: Every day | ORAL | 11 refills | Status: DC

## 2020-10-30 MED ORDER — METFORMIN HCL 500 MG PO TABS: 500.0000 mg | ORAL_TABLET | Freq: Two times a day (BID) | ORAL | 2 refills | Status: DC

## 2020-10-30 MED ORDER — CARVEDILOL 3.125 MG PO TABS: 3.1250 mg | ORAL_TABLET | Freq: Two times a day (BID) | ORAL | 2 refills | Status: DC

## 2020-10-30 MED ORDER — NITROGLYCERIN 0.4 MG SL SUBL: 0.4000 mg | SUBLINGUAL_TABLET | SUBLINGUAL | 2 refills | Status: DC | PRN

## 2020-10-30 MED ORDER — LOSARTAN POTASSIUM 25 MG PO TABS: 25.0000 mg | ORAL_TABLET | Freq: Every day | ORAL | 2 refills | Status: DC

## 2020-10-30 MED ORDER — ATORVASTATIN CALCIUM 80 MG PO TABS: 80.0000 mg | ORAL_TABLET | Freq: Every day | ORAL | 2 refills | Status: DC

## 2020-10-30 NOTE — Progress Notes (Signed)
Patient has not eaten today and patient has taken medication today. Patient denies pain at this time. Patient prefers not to take blood thinner due to severe body aches. Patient does take aspirin and chronic medications.
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
## 2020-10-30 NOTE — Telephone Encounter (Signed)
Rxs were sent today. Pt was seen by Cari.

## 2020-10-30 NOTE — Patient Instructions (Signed)
We will call you with today's lab results.  I strongly encourage you to follow-up with cardiology for further evaluation of the need for frequent use of nitroglycerin.  Roney Jaffe, PA-C Physician Assistant [facility] [email or web address]   Health Maintenance, Male Adopting a healthy lifestyle and getting preventive care are important in promoting health and wellness. Ask your health care provider about: The right schedule for you to have regular tests and exams. Things you can do on your own to prevent diseases and keep yourself healthy. What should I know about diet, weight, and exercise? Eat a healthy diet  Eat a diet that includes plenty of vegetables, fruits, low-fat dairy products, and lean protein. Do not eat a lot of foods that are high in solid fats, added sugars, or sodium. Maintain a healthy weight Body mass index (BMI) is a measurement that can be used to identify possible weight problems. It estimates body fat based on height and weight. Your health care provider can help determine your BMI and help you achieve or maintain a healthy weight. Get regular exercise Get regular exercise. This is one of the most important things you can do for your health. Most adults should: Exercise for at least 150 minutes each week. The exercise should increase your heart rate and make you sweat (moderate-intensity exercise). Do strengthening exercises at least twice a week. This is in addition to the moderate-intensity exercise. Spend less time sitting. Even light physical activity can be beneficial. Watch cholesterol and blood lipids Have your blood tested for lipids and cholesterol at 55 years of age, then have this test every 5 years. You may need to have your cholesterol levels checked more often if: Your lipid or cholesterol levels are high. You are older than 55 years of age. You are at high risk for heart disease. What should  I know about cancer screening? Many types of cancers can be detected early and may often be prevented. Depending on your health history and family history, you may need to have cancer screening at various ages. This may include screening for: Colorectal cancer. Prostate cancer. Skin cancer. Lung cancer. What should I know about heart disease, diabetes, and high blood pressure? Blood pressure and heart disease High blood pressure causes heart disease and increases the risk of stroke. This is more likely to develop in people who have high blood pressure readings, are of African descent, or are overweight. Talk with your health care provider about your target blood pressure readings. Have your blood pressure checked: Every 3-5 years if you are 55-19 years of age. Every year if you are 41 years old or older. If you are between the ages of 55 and 44 and are a current or former smoker, ask your health care provider if you should have a one-time screening for abdominal aortic aneurysm (AAA). Diabetes Have regular diabetes screenings. This checks your fasting blood sugar level. Have the screening done: Once every three years after age 55 if you are at a normal weight and have a low risk for diabetes. More often and at a younger age if you are overweight or have a high risk for diabetes. What should I know about preventing infection? Hepatitis B If you have a higher risk for hepatitis B, you should be screened for this virus. Talk with your health care provider to find out if you are at risk for hepatitis B infection. Hepatitis C Blood testing is recommended for: Everyone born from [birth year] through 1965. Anyone  with known risk factors for hepatitis C. Sexually transmitted infections (STIs) You should be screened each year for STIs, including gonorrhea and chlamydia, if: You are sexually active and are younger than 55 years of age. You are older than 55 years of age and your health care provider tells  you that you are at risk for this type of infection. Your sexual activity has changed since you were last screened, and you are at increased risk for chlamydia or gonorrhea. Ask your health care provider if you are at risk. Ask your health care provider about whether you are at high risk for HIV. Your health care provider may recommend a prescription medicine to help prevent HIV infection. If you choose to take medicine to prevent HIV, you should first get tested for HIV. You should then be tested every 3 months for as long as you are taking the medicine. Follow these instructions at home: Lifestyle Do not use any products that contain nicotine or tobacco, such as cigarettes, e-cigarettes, and chewing tobacco. If you need help quitting, ask your health care provider. Do not use street drugs. Do not share needles. Ask your health care provider for help if you need support or information about quitting drugs. Alcohol use Do not drink alcohol if your health care provider tells you not to drink. If you drink alcohol: Limit how much you have to 0-2 drinks a day. Be aware of how much alcohol is in your drink. In the [place] one drink equals one 12 oz bottle of beer (355 mL), one 5 oz glass of wine (148 mL), or one 1 oz glass of hard liquor (44 mL). General instructions Schedule regular health, dental, and eye exams. Stay current with your vaccines. Tell your health care provider if: You often feel depressed. You have ever been abused or do not feel safe at home. Summary Adopting a healthy lifestyle and getting preventive care are important in promoting health and wellness. Follow your health care provider's instructions about healthy diet, exercising, and getting tested or screened for diseases. Follow your health care provider's instructions on monitoring your cholesterol and blood pressure. This information is not intended to replace advice given to you by your health care provider. Make sure you  discuss any questions you have with your health care provider. Document Revised: 03/08/2020 Document Reviewed: 12/22/2017 Elsevier Patient Education  2022 [employer].

## 2020-10-31 LAB — LIPID PANEL
Chol/HDL Ratio: 5.2 ratio — ABNORMAL HIGH (ref 0.0–5.0)
Cholesterol, Total: 227 mg/dL — ABNORMAL HIGH (ref 100–199)
HDL: 44 mg/dL (ref >39)
LDL Chol Calc (NIH): 131 mg/dL — ABNORMAL HIGH (ref 0–99)
Triglycerides: 293 mg/dL — ABNORMAL HIGH (ref 0–149)
VLDL Cholesterol Cal: 52 mg/dL — ABNORMAL HIGH (ref 5–40)

## 2020-10-31 LAB — CBC WITH DIFFERENTIAL/PLATELET
Basophils Absolute: 0.1 10*3/uL (ref 0.0–0.2)
Basos: 1 %
EOS (ABSOLUTE): 0.2 10*3/uL (ref 0.0–0.4)
Eos: 3 %
Hematocrit: 42.8 % (ref 37.5–51.0)
Hemoglobin: 15.3 g/dL (ref 13.0–17.7)
Immature Grans (Abs): 0 10*3/uL (ref 0.0–0.1)
Immature Granulocytes: 1 %
Lymphocytes Absolute: 1.8 10*3/uL (ref 0.7–3.1)
Lymphs: 26 %
MCH: 31.2 pg (ref 26.6–33.0)
MCHC: 35.7 g/dL (ref 31.5–35.7)
MCV: 87 fL (ref 79–97)
Monocytes Absolute: 0.6 10*3/uL (ref 0.1–0.9)
Monocytes: 8 %
Neutrophils Absolute: 4.3 10*3/uL (ref 1.4–7.0)
Neutrophils: 61 %
Platelets: 254 10*3/uL (ref 150–450)
RBC: 4.91 x10E6/uL (ref 4.14–5.80)
RDW: 12.7 % (ref 11.6–15.4)
WBC: 7 10*3/uL (ref 3.4–10.8)

## 2020-10-31 LAB — COMP. METABOLIC PANEL (12)
AST: 42 IU/L — ABNORMAL HIGH (ref 0–40)
Albumin/Globulin Ratio: 2 (ref 1.2–2.2)
Albumin: 4.9 g/dL (ref 3.8–4.9)
Alkaline Phosphatase: 105 IU/L (ref 44–121)
BUN/Creatinine Ratio: 14 (ref 9–20)
BUN: 12 mg/dL (ref 6–24)
Bilirubin Total: 0.2 mg/dL (ref 0.0–1.2)
Calcium: 9.6 mg/dL (ref 8.7–10.2)
Chloride: 100 mmol/L (ref 96–106)
Creatinine, Ser: 0.86 mg/dL (ref 0.76–1.27)
Globulin, Total: 2.4 g/dL (ref 1.5–4.5)
Glucose: 103 mg/dL — ABNORMAL HIGH (ref 70–99)
Potassium: 4.6 mmol/L (ref 3.5–5.2)
Sodium: 141 mmol/L (ref 134–144)
Total Protein: 7.3 g/dL (ref 6.0–8.5)
eGFR: 102 mL/min/{1.73_m2} (ref >59)

## 2020-10-31 LAB — HCV AB W REFLEX TO QUANT PCR: HCV Ab: <0.1 s/co ratio (ref 0.0–0.9)

## 2020-10-31 LAB — HCV INTERPRETATION

## 2020-10-31 LAB — PSA: Prostate Specific Ag, Serum: 0.5 ng/mL (ref 0.0–4.0)

## 2020-11-01 DIAGNOSIS — R7303 Prediabetes: Secondary | ICD-10-CM | POA: Insufficient documentation

## 2020-11-01 DIAGNOSIS — E6609 Other obesity due to excess calories: Secondary | ICD-10-CM | POA: Insufficient documentation

## 2020-11-01 DIAGNOSIS — I2510 Atherosclerotic heart disease of native coronary artery without angina pectoris: Secondary | ICD-10-CM | POA: Insufficient documentation

## 2020-11-01 DIAGNOSIS — E66811 Obesity, class 1: Secondary | ICD-10-CM | POA: Insufficient documentation

## 2020-11-01 DIAGNOSIS — I252 Old myocardial infarction: Secondary | ICD-10-CM | POA: Insufficient documentation

## 2020-11-08 ENCOUNTER — Telehealth: Payer: Self-pay | Admitting: *Deleted

## 2020-11-08 NOTE — Telephone Encounter (Signed)
Patient verified DOB Patient is aware of labs and screenings being normal except for cholesterol and needing to follow a low cholesterol diet and adhere to medication with a recheck in 3 months.

## 2020-11-08 NOTE — Telephone Encounter (Signed)
-----   Message from Roney Jaffe, [place] sent at 11/01/2020 10:46 AM EDT ----- Please call patient and let him know that his kidney function and liver function are within normal limits.  He does have 1 liver enzyme that is slightly elevated however not of concern.  His screening for hepatitis C and prostate cancer were negative.  He does not show signs of anemia.  His cholesterol is very elevated including his triglyceride levels.  It is extremely important that he follow a low-cholesterol diet and maintain compliance to his cholesterol medication.  He should have these cholesterol levels rechecked in 3 months.

## 2021-03-26 ENCOUNTER — Other Ambulatory Visit: Payer: Self-pay | Admitting: Physician Assistant

## 2021-03-26 DIAGNOSIS — I2510 Atherosclerotic heart disease of native coronary artery without angina pectoris: Secondary | ICD-10-CM

## 2021-03-26 DIAGNOSIS — I10 Essential (primary) hypertension: Secondary | ICD-10-CM

## 2021-03-26 DIAGNOSIS — E785 Hyperlipidemia, unspecified: Secondary | ICD-10-CM

## 2021-03-27 ENCOUNTER — Other Ambulatory Visit: Payer: Self-pay | Admitting: Nurse Practitioner

## 2021-03-27 NOTE — Telephone Encounter (Signed)
Medication Refill - Medication: atorvastatin (LIPITOR) 80 MG tablet ?losartan (COZAAR) 25 MG tablet ? ?Has the patient contacted their pharmacy? Yes.   ?Pt has appt w/ Zelda for his refills.  Would like to know if you can go on and send in for him? ? ?Preferred Pharmacy (with phone number or street name): [facility] [address] ? ?Has the patient been seen for an appointment in the last year OR does the patient have an upcoming appointment? Yes.   ? ?Agent: Please be advised that RX refills may take up to 3 business days. We ask that you follow-up with your pharmacy.  ?

## 2021-03-28 MED ORDER — ATORVASTATIN CALCIUM 80 MG PO TABS: 80.0000 mg | ORAL_TABLET | Freq: Every day | ORAL | 0 refills | Status: DC

## 2021-03-28 MED ORDER — LOSARTAN POTASSIUM 25 MG PO TABS: 25.0000 mg | ORAL_TABLET | Freq: Every day | ORAL | 0 refills | Status: DC

## 2021-03-28 NOTE — Telephone Encounter (Signed)
Requested Prescriptions  ?Pending Prescriptions Disp Refills  ?? atorvastatin (LIPITOR) 80 MG tablet 30 tablet 0  ?  Sig: Take 1 tablet (80 mg total) by mouth daily.  ?  ? Cardiovascular:  Antilipid - Statins Failed - 03/27/2021  4:21 PM  ?  ?  Failed - Lipid Panel in normal range within the last 12 months  ?  Cholesterol, Total  ?Date Value Ref Range Status  ?10/30/2020 227 (H) 100 - 199 mg/dL Final  ? ?LDL Chol Calc (NIH)  ?Date Value Ref Range Status  ?10/30/2020 131 (H) 0 - 99 mg/dL Final  ? ?HDL  ?Date Value Ref Range Status  ?10/30/2020 44 >39 mg/dL Final  ? ?Triglycerides  ?Date Value Ref Range Status  ?10/30/2020 293 (H) 0 - 149 mg/dL Final  ? ?  ?  ?  Passed - Patient is not pregnant  ?  ?  Passed - Valid encounter within last 12 months  ?  Recent Outpatient Visits   ?      ? 4 months ago Essential hypertension  ? [facility] Mayers, Cari S, PA-C  ? 11 months ago Prediabetes  ? [facility] McClung, Angela M, PA-C  ? 1 year ago H/O acute myocardial infarction  ? [facility] McClung, Angela M, PA-C  ? 1 year ago   ? [facility]  ? 2 years ago Patient left without being seen  ? [facility] Fulp, Cammie, MD  ?  ?  ? ?  ?  ?  ?? losartan (COZAAR) 25 MG tablet 30 tablet 0  ?  Sig: Take 1 tablet (25 mg total) by mouth daily.  ?  ? Cardiovascular:  Angiotensin Receptor Blockers Failed - 03/27/2021  4:21 PM  ?  ?  Failed - Last BP in normal range  ?  BP Readings from Last 1 Encounters:  ?10/30/20 (!) 143/93  ?   ?  ?  Passed - Cr in normal range and within 180 days  ?  Creatinine, Ser  ?Date Value Ref Range Status  ?10/30/2020 0.86 0.76 - 1.27 mg/dL Final  ?   ?  ?  Passed - K in normal range and within 180 days  ?  Potassium  ?Date Value Ref Range Status  ?10/30/2020 4.6 3.5 - 5.2 mmol/L Final  ?   ?  ?  Passed - Patient is not pregnant  ?  ?  Passed - Valid encounter  within last 6 months  ?  Recent Outpatient Visits   ?      ? 4 months ago Essential hypertension  ? [facility] Mayers, Cari S, PA-C  ? 11 months ago Prediabetes  ? [facility] McClung, Angela M, PA-C  ? 1 year ago H/O acute myocardial infarction  ? [facility] McClung, Angela M, PA-C  ? 1 year ago   ? [facility]  ? 2 years ago Patient left without being seen  ?  [facility] Fulp, Cammie, MD  ?  ?  ? ?  ?  ?  ? ? ?
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
## 2021-04-01 ENCOUNTER — Telehealth: Payer: Self-pay | Admitting: Nurse Practitioner

## 2021-04-01 NOTE — Telephone Encounter (Signed)
No answer. LVM ?

## 2021-04-08 ENCOUNTER — Telehealth: Payer: Self-pay | Admitting: Nurse Practitioner

## 2021-04-08 ENCOUNTER — Ambulatory Visit: Payer: Self-pay | Attending: Nurse Practitioner | Admitting: Nurse Practitioner

## 2021-04-08 ENCOUNTER — Other Ambulatory Visit: Payer: Self-pay

## 2021-04-08 NOTE — Telephone Encounter (Signed)
NO answer. LVM to return call to office ?

## 2021-04-08 NOTE — Telephone Encounter (Signed)
NO ANSWER [phone]  ?LVM to return call to office  ?

## 2021-05-06 ENCOUNTER — Telehealth: Payer: Self-pay | Admitting: Nurse Practitioner

## 2021-05-06 ENCOUNTER — Ambulatory Visit (HOSPITAL_BASED_OUTPATIENT_CLINIC_OR_DEPARTMENT_OTHER): Payer: Self-pay | Admitting: Nurse Practitioner

## 2021-05-06 ENCOUNTER — Encounter: Payer: Self-pay | Admitting: Nurse Practitioner

## 2021-05-06 DIAGNOSIS — Z1211 Encounter for screening for malignant neoplasm of colon: Secondary | ICD-10-CM

## 2021-05-06 DIAGNOSIS — E785 Hyperlipidemia, unspecified: Secondary | ICD-10-CM

## 2021-05-06 DIAGNOSIS — I10 Essential (primary) hypertension: Secondary | ICD-10-CM

## 2021-05-06 DIAGNOSIS — I2510 Atherosclerotic heart disease of native coronary artery without angina pectoris: Secondary | ICD-10-CM

## 2021-05-06 DIAGNOSIS — R7303 Prediabetes: Secondary | ICD-10-CM

## 2021-05-06 MED ORDER — LOSARTAN POTASSIUM 25 MG PO TABS: 25.0000 mg | ORAL_TABLET | Freq: Every day | ORAL | 0 refills | Status: DC

## 2021-05-06 MED ORDER — CLOPIDOGREL BISULFATE 75 MG PO TABS: 75.0000 mg | ORAL_TABLET | Freq: Every day | ORAL | 1 refills | Status: DC

## 2021-05-06 MED ORDER — CARVEDILOL 3.125 MG PO TABS: 3.1250 mg | ORAL_TABLET | Freq: Two times a day (BID) | ORAL | 0 refills | Status: DC

## 2021-05-06 MED ORDER — ATORVASTATIN CALCIUM 80 MG PO TABS: 80.0000 mg | ORAL_TABLET | Freq: Every day | ORAL | 0 refills | Status: DC
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
## 2021-05-06 NOTE — Telephone Encounter (Signed)
NO ANSWER [phone]  ?LVM to return call to office  ?

## 2021-05-27 ENCOUNTER — Other Ambulatory Visit: Payer: Self-pay

## 2021-06-10 ENCOUNTER — Other Ambulatory Visit: Payer: Self-pay | Admitting: Nurse Practitioner

## 2021-06-10 DIAGNOSIS — I2510 Atherosclerotic heart disease of native coronary artery without angina pectoris: Secondary | ICD-10-CM

## 2021-06-10 DIAGNOSIS — I10 Essential (primary) hypertension: Secondary | ICD-10-CM

## 2021-06-10 DIAGNOSIS — E785 Hyperlipidemia, unspecified: Secondary | ICD-10-CM

## 2021-06-19 ENCOUNTER — Ambulatory Visit: Payer: Self-pay | Attending: Physician Assistant | Admitting: Physician Assistant

## 2021-06-19 ENCOUNTER — Encounter: Payer: Self-pay | Admitting: Physician Assistant

## 2021-06-19 VITALS — BP 134/74 | HR 74 | Wt 240.2 lb

## 2021-06-19 DIAGNOSIS — E785 Hyperlipidemia, unspecified: Secondary | ICD-10-CM

## 2021-06-19 DIAGNOSIS — I2510 Atherosclerotic heart disease of native coronary artery without angina pectoris: Secondary | ICD-10-CM

## 2021-06-19 DIAGNOSIS — R7303 Prediabetes: Secondary | ICD-10-CM

## 2021-06-19 DIAGNOSIS — I10 Essential (primary) hypertension: Secondary | ICD-10-CM

## 2021-06-19 MED ORDER — ATORVASTATIN CALCIUM 80 MG PO TABS: 80.0000 mg | ORAL_TABLET | Freq: Every day | ORAL | 1 refills | Status: DC

## 2021-06-19 MED ORDER — METFORMIN HCL 500 MG PO TABS: 500.0000 mg | ORAL_TABLET | Freq: Two times a day (BID) | ORAL | 2 refills | Status: DC

## 2021-06-19 MED ORDER — CLOPIDOGREL BISULFATE 75 MG PO TABS: 75.0000 mg | ORAL_TABLET | Freq: Every day | ORAL | 1 refills | Status: AC

## 2021-06-19 MED ORDER — CARVEDILOL 3.125 MG PO TABS: 3.1250 mg | ORAL_TABLET | Freq: Two times a day (BID) | ORAL | 1 refills | Status: DC

## 2021-06-19 MED ORDER — LOSARTAN POTASSIUM 25 MG PO TABS: 25.0000 mg | ORAL_TABLET | Freq: Every day | ORAL | 1 refills | Status: DC

## 2021-06-19 NOTE — Progress Notes (Signed)
Patient ID: Derrick Villa, male   DOB: May 03, 1965, 56 y.o.   MRN: [id number]   Derrick Villa, is a 56 y.o. male  KXF:[id number]  IRC:[id number]  DOB - 04/08/1965  Chief Complaint  Patient presents with   Hypertension   Medication Refill       Subjective:   Derrick Villa is a 56 y.o. male here today for med RF and he quit smoking 2 weeks ago.  Denies CP/SOB/HA/dizziness.  No new issues or concerns. Out of meds about 10 days  No problems updated.  ALLERGIES: No Known Allergies  PAST MEDICAL HISTORY: Past Medical History:  Diagnosis Date   CAD (coronary artery disease), native coronary artery    Diverticulitis 09/2016   Hyperlipidemia    Hypertension    NSTEMI (non-ST elevated myocardial infarction) [facility])     MEDICATIONS AT HOME: Prior to Admission medications   Medication Sig Start Date End Date Taking? Authorizing Provider  aspirin 81 MG chewable tablet Chew 1 tablet (81 mg total) by mouth daily. 10/30/20  Yes [place], Cari S, PA-C  nitroGLYCERIN (NITROSTAT) 0.4 MG SL tablet Place 1 tablet (0.4 mg total) under the tongue every 5 (five) minutes x 3 doses as needed for chest pain. 10/30/20  Yes [place], Cari S, PA-C  atorvastatin (LIPITOR) 80 MG tablet Take 1 tablet (80 mg total) by mouth daily. 06/19/21   Anders Simmonds, PA-C  carvedilol (COREG) 3.125 MG tablet Take 1 tablet (3.125 mg total) by mouth 2 (two) times daily. 06/19/21   Anders Simmonds, PA-C  clopidogrel (PLAVIX) 75 MG tablet Take 1 tablet (75 mg total) by mouth daily. 06/19/21 12/16/21  Anders Simmonds, PA-C  losartan (COZAAR) 25 MG tablet Take 1 tablet (25 mg total) by mouth daily. 06/19/21   Anders Simmonds, PA-C  metFORMIN (GLUCOPHAGE) 500 MG tablet Take 1 tablet (500 mg total) by mouth 2 (two) times daily with a meal. 06/19/21   Aaren Krog, Marzella Schlein, PA-C    ROS: Neg HEENT Neg resp Neg cardiac Neg GI Neg GU Neg MS Neg psych Neg neuro  Objective:   Vitals:   06/19/21 0904 06/19/21 0921  BP: (!) 165/92  134/74  Pulse: 74   SpO2: 96%   Weight: 240 lb 3.2 oz (109 kg)    Exam General appearance : Awake, alert, not in any distress. Speech Clear. Not toxic looking HEENT: Atraumatic and Normocephalic Neck: Supple, no JVD. No cervical lymphadenopathy.  Chest: Good air entry bilaterally, CTAB.  No rales/rhonchi/wheezing CVS: S1 S2 regular, no murmurs.  Extremities: B/L Lower Ext shows no edema, both legs are warm to touch Neurology: Awake alert, and oriented X 3, CN II-XII intact, Non focal Skin: No Rash  Data Review Lab Results  Component Value Date   HGBA1C 5.9 (A) 10/30/2020   HGBA1C 5.6 05/02/2020   HGBA1C 6.1 (H) 07/26/2019    Assessment & Plan   1. Essential hypertension Controlled-(didn't take meds in last few days - carvedilol (COREG) 3.125 MG tablet; Take 1 tablet (3.125 mg total) by mouth 2 (two) times daily.  Dispense: 180 tablet; Refill: 1 - losartan (COZAAR) 25 MG tablet; Take 1 tablet (25 mg total) by mouth daily.  Dispense: 90 tablet; Refill: 1 - Comprehensive metabolic panel - CBC with Differential/Platelet  2. Prediabetes I have had a lengthy discussion and provided education about insulin resistance and the intake of too much sugar/refined carbohydrates.  I have advised the patient to work at a goal of eliminating sugary drinks, candy,  desserts, sweets, refined sugars, processed foods, and white carbohydrates.  The patient expresses understanding.   - metFORMIN (GLUCOPHAGE) 500 MG tablet; Take 1 tablet (500 mg total) by mouth 2 (two) times daily with a meal.  Dispense: 60 tablet; Refill: 2 - Comprehensive metabolic panel - Hemoglobin A1c - CBC with Differential/Platelet  3. Coronary artery disease involving native coronary artery of native heart without angina pectoris Great job on smoking cessation!!! Keep up the good work - atorvastatin (LIPITOR) 80 MG tablet; Take 1 tablet (80 mg total) by mouth daily.  Dispense: 90 tablet; Refill: 1 - clopidogrel (PLAVIX) 75  MG tablet; Take 1 tablet (75 mg total) by mouth daily.  Dispense: 90 tablet; Refill: 1 - carvedilol (COREG) 3.125 MG tablet; Take 1 tablet (3.125 mg total) by mouth 2 (two) times daily.  Dispense: 180 tablet; Refill: 1 - CBC with Differential/Platelet  4. Hyperlipidemia LDL goal <70 - atorvastatin (LIPITOR) 80 MG tablet; Take 1 tablet (80 mg total) by mouth daily.  Dispense: 90 tablet; Refill: 1 - Comprehensive metabolic panel - Lipid panel    Return in about 6 months (around 12/19/2021) for PCP for chronic conditions.  The patient was given clear instructions to go to ER or return to medical center if symptoms don't improve, worsen or new problems develop. The patient verbalized understanding. The patient was told to call to get lab results if they haven't heard anything in the next week.      Georgian Co, PA-C [facility] [place], [place] [phone]   06/19/2021, 9:27 AM

## 2021-06-20 ENCOUNTER — Other Ambulatory Visit: Payer: Self-pay | Admitting: Physician Assistant

## 2021-06-20 LAB — CBC WITH DIFFERENTIAL/PLATELET
Basophils Absolute: 0.1 10*3/uL (ref 0.0–0.2)
Basos: 1 %
EOS (ABSOLUTE): 0.2 10*3/uL (ref 0.0–0.4)
Eos: 3 %
Hematocrit: 44.4 % (ref 37.5–51.0)
Hemoglobin: 15 g/dL (ref 13.0–17.7)
Immature Grans (Abs): 0.1 10*3/uL (ref 0.0–0.1)
Immature Granulocytes: 1 %
Lymphocytes Absolute: 1.9 10*3/uL (ref 0.7–3.1)
Lymphs: 26 %
MCH: 30.1 pg (ref 26.6–33.0)
MCHC: 33.8 g/dL (ref 31.5–35.7)
MCV: 89 fL (ref 79–97)
Monocytes Absolute: 0.5 10*3/uL (ref 0.1–0.9)
Monocytes: 7 %
Neutrophils Absolute: 4.5 10*3/uL (ref 1.4–7.0)
Neutrophils: 62 %
Platelets: 244 10*3/uL (ref 150–450)
RBC: 4.98 x10E6/uL (ref 4.14–5.80)
RDW: 12.8 % (ref 11.6–15.4)
WBC: 7.3 10*3/uL (ref 3.4–10.8)

## 2021-06-20 LAB — HEMOGLOBIN A1C
Est. average glucose Bld gHb Est-mCnc: 126 mg/dL
Hgb A1c MFr Bld: 6 % — ABNORMAL HIGH (ref 4.8–5.6)

## 2021-06-20 LAB — COMPREHENSIVE METABOLIC PANEL
ALT: 43 IU/L (ref 0–44)
AST: 26 IU/L (ref 0–40)
Albumin/Globulin Ratio: 1.7 (ref 1.2–2.2)
Albumin: 4.6 g/dL (ref 3.8–4.9)
Alkaline Phosphatase: 100 IU/L (ref 44–121)
BUN/Creatinine Ratio: 11 (ref 9–20)
BUN: 8 mg/dL (ref 6–24)
Bilirubin Total: 0.2 mg/dL (ref 0.0–1.2)
CO2: 22 mmol/L (ref 20–29)
Calcium: 9.4 mg/dL (ref 8.7–10.2)
Chloride: 104 mmol/L (ref 96–106)
Creatinine, Ser: 0.74 mg/dL — ABNORMAL LOW (ref 0.76–1.27)
Globulin, Total: 2.7 g/dL (ref 1.5–4.5)
Glucose: 99 mg/dL (ref 70–99)
Potassium: 4.6 mmol/L (ref 3.5–5.2)
Sodium: 140 mmol/L (ref 134–144)
Total Protein: 7.3 g/dL (ref 6.0–8.5)
eGFR: 107 mL/min/{1.73_m2} (ref >59)

## 2021-06-20 LAB — LIPID PANEL
Chol/HDL Ratio: 5.6 ratio — ABNORMAL HIGH (ref 0.0–5.0)
Cholesterol, Total: 246 mg/dL — ABNORMAL HIGH (ref 100–199)
HDL: 44 mg/dL (ref >39)
LDL Chol Calc (NIH): 136 mg/dL — ABNORMAL HIGH (ref 0–99)
Triglycerides: 367 mg/dL — ABNORMAL HIGH (ref 0–149)
VLDL Cholesterol Cal: 66 mg/dL — ABNORMAL HIGH (ref 5–40)

## 2021-08-06 ENCOUNTER — Encounter: Payer: Self-pay | Admitting: Nurse Practitioner

## 2021-08-06 ENCOUNTER — Ambulatory Visit: Payer: Self-pay | Attending: Nurse Practitioner | Admitting: Nurse Practitioner

## 2021-08-06 VITALS — BP 115/78 | HR 81 | Temp 98.3°F | Resp 16 | Ht 72.0 in | Wt 233.0 lb

## 2021-08-06 DIAGNOSIS — R7303 Prediabetes: Secondary | ICD-10-CM

## 2021-08-06 DIAGNOSIS — I10 Essential (primary) hypertension: Secondary | ICD-10-CM

## 2021-08-06 DIAGNOSIS — Z1211 Encounter for screening for malignant neoplasm of colon: Secondary | ICD-10-CM
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
## 2021-08-08 LAB — FECAL OCCULT BLOOD, IMMUNOCHEMICAL: Fecal Occult Bld: NEGATIVE

## 2021-08-11 ENCOUNTER — Encounter: Payer: Self-pay | Admitting: Nurse Practitioner

## 2021-11-01 ENCOUNTER — Other Ambulatory Visit: Payer: Self-pay | Admitting: Physician Assistant

## 2021-11-01 DIAGNOSIS — R7303 Prediabetes: Secondary | ICD-10-CM

## 2021-12-02 ENCOUNTER — Ambulatory Visit: Payer: Self-pay | Admitting: *Deleted

## 2021-12-02 ENCOUNTER — Encounter: Payer: Self-pay | Admitting: Physician Assistant

## 2021-12-02 ENCOUNTER — Ambulatory Visit: Payer: Self-pay | Admitting: Physician Assistant

## 2021-12-02 VITALS — BP 165/90 | HR 74 | Resp 18 | Ht 72.0 in | Wt 235.0 lb

## 2021-12-02 DIAGNOSIS — I10 Essential (primary) hypertension: Secondary | ICD-10-CM

## 2021-12-02 DIAGNOSIS — F5104 Psychophysiologic insomnia: Secondary | ICD-10-CM

## 2021-12-02 DIAGNOSIS — Z720 Tobacco use: Secondary | ICD-10-CM

## 2021-12-02 DIAGNOSIS — Z716 Tobacco abuse counseling: Secondary | ICD-10-CM

## 2021-12-02 DIAGNOSIS — J302 Other seasonal allergic rhinitis: Secondary | ICD-10-CM

## 2021-12-02 DIAGNOSIS — F17210 Nicotine dependence, cigarettes, uncomplicated: Secondary | ICD-10-CM

## 2021-12-02 DIAGNOSIS — T148XXA Other injury of unspecified body region, initial encounter: Secondary | ICD-10-CM

## 2021-12-02 MED ORDER — CYCLOBENZAPRINE HCL 10 MG PO TABS: 10.0000 mg | ORAL_TABLET | Freq: Three times a day (TID) | ORAL | 0 refills | Status: DC | PRN

## 2021-12-02 MED ORDER — CETIRIZINE HCL 10 MG PO TABS: 10.0000 mg | ORAL_TABLET | Freq: Every day | ORAL | 11 refills | Status: DC

## 2021-12-02 NOTE — Telephone Encounter (Signed)
  Chief Complaint: RLQ pain- only hurts if bending over Symptoms: pain with bending Frequency: 4-5 days Pertinent Negatives: Patient denies back pain, diarrhea, fever, urination pain, vomiting  Disposition: [] ED /[] Urgent Care (no appt availability in office) / [] Appointment(In office/virtual)/ []  [place] [facility]/ [] Home Care/ [] Refused Recommended Disposition /[x] [facility]/ []  Follow-up with PCP Additional Notes: Patient advised per protocol- mobile unit/UC- no open appointment

## 2021-12-02 NOTE — Telephone Encounter (Signed)
Reason for Disposition  [1] MODERATE pain (e.g., interferes with normal activities) AND [2] pain comes and goes (cramps) AND [3] present > 24 hours  (Exception: Pain with Vomiting or Diarrhea - see that Guideline.)  Answer Assessment - Initial Assessment Questions 1. LOCATION: "Where does it hurt?"      RLQ 2. RADIATION: "Does the pain shoot anywhere else?" (e.g., chest, back)     No radiation 3. ONSET: "When did the pain begin?" (Minutes, hours or days ago)      4-5 days 4. SUDDEN: "Gradual or sudden onset?"     na 5. PATTERN "Does the pain come and go, or is it constant?"    - If it comes and goes: "How long does it last?" "Do you have pain now?"     (Note: Comes and goes means the pain is intermittent. It goes away completely between bouts.)    - If constant: "Is it getting better, staying the same, or getting worse?"      (Note: Constant means the pain never goes away completely; most serious pain is constant and gets worse.)      Comes and goes with bending 6. SEVERITY: "How bad is the pain?"  (e.g., Scale 1-10; mild, moderate, or severe)    - MILD (1-3): Doesn't interfere with normal activities, abdomen soft and not tender to touch.     - MODERATE (4-7): Interferes with normal activities or awakens from sleep, abdomen tender to touch.     - SEVERE (8-10): Excruciating pain, doubled over, unable to do any normal activities.       Moderate- only bending/moving 7. RECURRENT SYMPTOM: "Have you ever had this type of stomach pain before?" If Yes, ask: "When was the last time?" and "What happened that time?"      no 8. CAUSE: "What do you think is causing the stomach pain?"     Unsure- works in [employer], slight tenderness 9. RELIEVING/AGGRAVATING FACTORS: "What makes it better or worse?" (e.g., antacids, bending or twisting motion, bowel movement)     Not being active 10. OTHER SYMPTOMS: "Do you have any other symptoms?" (e.g., back pain, diarrhea, fever, urination pain, vomiting)        no  Protocols used: Abdominal Pain - Male-A-AH

## 2021-12-02 NOTE — Progress Notes (Unsigned)
   Established Patient Office Visit  Subjective   Patient ID: Derrick Villa, male    DOB: 02-May-1965  Age: 56 y.o. MRN: [id number]  No chief complaint on file.   States that he has been having pain in his lower right abdomen for the past week Hurts when bending own to tie shoes or get; hurts when lifting things Dry Cough - week  No change in BM Normal -  No hernias in the past    Took meds today - has not been checking -  Water - gallon a day    Not sleeping - hard time to go to sleep - has not tried       {History (Optional):23778}  ROS    Objective:     There were no vitals taken for this visit. {Vitals History (Optional):23777}  Physical Exam   No results found for any visits on 12/02/21.  {Labs (Optional):23779}  The ASCVD Risk score (Arnett DK, et al., 2019) failed to calculate for the following reasons:   The patient has a prior MI or stroke diagnosis    Assessment & Plan:   Problem List Items Addressed This Visit   None   No follow-ups on file.    Kasandra Knudsen Mayers, PA-C

## 2021-12-02 NOTE — Patient Instructions (Addendum)
To help with your muscle strain, you are going to use flexeril 3 times a day as needed.    I also encourage you to start using Zyrtec on a daily basis to help with drainage and cough.  Your blood pressure is elevated today, I encourage you to check your blood pressure on a daily basis, keep a written log and have available for all office visits.  Please feel free to follow-up with the mobile unit in 2 weeks if your blood pressure readings continue to remain elevated  Roney Jaffe, PA-C Physician Assistant [facility] [email or web address]   Muscle Strain A muscle strain is an injury that occurs when a muscle is stretched beyond its normal length. Usually, a small number of muscle fibers are torn when this happens. There are three types of muscle strains. First-degree strains have the least amount of muscle fiber tearing and the least amount of pain. Second-degree and third-degree strains have more tearing and pain. Usually, recovery from muscle strain takes 1-2 weeks. Complete healing normally takes 5-6 weeks. What are the causes? This condition is caused when a sudden, violent force is placed on a muscle and stretches it too far. This may occur with a fall, while lifting, or during sports. What increases the risk? This condition is more likely to develop in athletes and people who are physically active. What are the signs or symptoms? Symptoms of this condition include: Pain. Tenderness. Bruising. Swelling. Trouble using the muscle. How is this diagnosed? This condition is diagnosed based on a physical exam and your medical history. Tests may also be done, including an X-ray, ultrasound, or MRI. How is this treated? This condition is initially treated with PRICE therapy. This therapy involves: Protecting the muscle from being injured again. Resting the injured muscle. Icing the injured muscle. Applying pressure (compression) to  the injured muscle. This may be done with a splint or elastic bandage. Raising (elevating) the injured muscle. Your health care provider may also recommend medicine for pain. Follow these instructions at home: If you have a removable splint: Wear the splint as told by your health care provider. Remove it only as told by your health care provider. Check the skin around the splint every day. Tell your health care provider about any concerns. Loosen the splint if your fingers or toes tingle, become numb, or turn cold and blue. Keep the splint clean. If the splint is not waterproof: Do not let it get wet. Cover it with a watertight covering when you take a bath or a shower. Managing pain, stiffness, and swelling  If directed, put ice on the injured area. To do this: If you have a removable splint, remove it as told by your health care provider. Put ice in a plastic bag. Place a towel between your skin and the bag. Leave the ice on for 20 minutes, 2-3 times a day. Remove the ice if your skin turns bright red. This is very important. If you cannot feel pain, heat, or cold, you have a greater risk of damage to the area. Move your fingers or toes often to reduce stiffness and swelling. Raise (elevate) the injured area above the level of your heart while you are sitting or lying down. Wear an elastic bandage as told by your health care provider. Make sure that it is not too tight. General instructions Take over-the-counter and prescription medicines only as told by your health care provider. Treatment may include muscle relaxants or medicines for pain and  inflammation that are taken by mouth or applied to the skin. Restrict your activity and rest the injured muscle as told by your health care provider. Gentle movements may be allowed. If physical therapy was prescribed, do exercises as told by your health care provider. Do not put pressure on any part of the splint until it is fully hardened. This  may take several hours. Do not use any products that contain nicotine or tobacco. These products include cigarettes, chewing tobacco, and vaping devices, such as e-cigarettes. If you need help quitting, ask your health care provider. Ask your health care provider when it is safe to drive if you have a splint. Keep all follow-up visits. This is important. How is this prevented? Warm up before exercising. This helps to prevent future muscle strains. Contact a health care provider if: You have more pain or swelling in the injured area. Get help right away if: You have numbness or tingling in the injured area. You lose a lot of strength in the injured area. Summary A muscle strain is an injury that occurs when a muscle is stretched beyond its normal length. This condition is caused when a sudden, violent force is placed on a muscle and stretches it too far. This condition is initially treated with PRICE therapy, which involves protecting, resting, icing, compressing, and elevating. Gentle movements may be allowed. If physical therapy was prescribed, do exercises as told by your health care provider. This information is not intended to replace advice given to you by your health care provider. Make sure you discuss any questions you have with your health care provider. Document Revised: 03/18/2020 Document Reviewed: 03/18/2020 Elsevier Patient Education  2023 [employer].  How to Take Your Blood Pressure Blood pressure is a measurement of how strongly your blood is pressing against the walls of your arteries. Arteries are blood vessels that carry blood from your heart throughout your body. Your health care provider takes your blood pressure at each office visit. You can also take your own blood pressure at home with a blood pressure monitor. You may need to take your own blood pressure to: Confirm a diagnosis of high blood pressure (hypertension). Monitor your blood pressure over time. Make sure  your blood pressure medicine is working. Supplies needed: Blood pressure monitor. A chair to sit in. This should be a chair where you can sit upright with your back supported. Do not sit on a soft couch or an armchair. Table or desk. Small notebook and pencil or pen. How to prepare To get the most accurate reading, avoid the following for 30 minutes before you check your blood pressure: Drinking caffeine. Drinking alcohol. Eating. Smoking. Exercising. Five minutes before you check your blood pressure: Use the bathroom and urinate so that you have an empty bladder. Sit quietly in a chair. Do not talk. How to take your blood pressure To check your blood pressure, follow the instructions in the manual that came with your blood pressure monitor. If you have a digital blood pressure monitor, the instructions may be as follows: Sit up straight in a chair. Place your feet on the floor. Do not cross your ankles or legs. Rest your left arm at the level of your heart on a table or desk or on the arm of a chair. Pull up your shirt sleeve. Wrap the blood pressure cuff around the upper part of your left arm, 1 inch (2.5 cm) above your elbow. It is best to wrap the cuff around bare  skin. Fit the cuff snugly, but not too tightly, around your arm. You should be able to place only one finger between the cuff and your arm. Position the cord so that it rests in the bend of your elbow. Press the power button. Sit quietly while the cuff inflates and deflates. Read the digital reading on the monitor screen and write the numbers down (record them) in a notebook. Wait 2-3 minutes, then repeat the steps, starting at step 1. What does my blood pressure reading mean? A blood pressure reading consists of a higher number over a lower number. Ideally, your blood pressure should be below 120/80. The first ("top") number is called the systolic pressure. It is a measure of the pressure in your arteries as your heart  beats. The second ("bottom") number is called the diastolic pressure. It is a measure of the pressure in your arteries as the heart relaxes. Blood pressure is classified into four stages. The following are the stages for adults who do not have a short-term serious illness or a chronic condition. Systolic pressure and diastolic pressure are measured in a unit called mm Hg (millimeters of mercury).  Normal Systolic pressure: below 120. Diastolic pressure: below 80. Elevated Systolic pressure: 120-129. Diastolic pressure: below 80. Hypertension stage 1 Systolic pressure: 130-139. Diastolic pressure: 80-89. Hypertension stage 2 Systolic pressure: 140 or above. Diastolic pressure: 90 or above. You can have elevated blood pressure or hypertension even if only the systolic or only the diastolic number in your reading is higher than normal. Follow these instructions at home: Medicines Take over-the-counter and prescription medicines only as told by your health care provider. Tell your health care provider if you are having any side effects from blood pressure medicine. General instructions Check your blood pressure as often as recommended by your health care provider. Check your blood pressure at the same time every day. Take your monitor to the next appointment with your health care provider to make sure that: You are using it correctly. It provides accurate readings. Understand what your goal blood pressure numbers are. Keep all follow-up visits. This is important. General tips Your health care provider can suggest a reliable monitor that will meet your needs. There are several types of home blood pressure monitors. Choose a monitor that has an arm cuff. Do not choose a monitor that measures your blood pressure from your wrist or finger. Choose a cuff that wraps snugly, not too tight or too loose, around your upper arm. You should be able to fit only one finger between your arm and the  cuff. You can buy a blood pressure monitor at most drugstores or online. Where to find more information American Heart Association: www.heart.org Contact a health care provider if: Your blood pressure is consistently high. Your blood pressure is suddenly low. Get help right away if: Your systolic blood pressure is higher than 180. Your diastolic blood pressure is higher than 120. These symptoms may be an emergency. Get help right away. Call 911. Do not wait to see if the symptoms will go away. Do not drive yourself to the hospital. Summary Blood pressure is a measurement of how strongly your blood is pressing against the walls of your arteries. A blood pressure reading consists of a higher number over a lower number. Ideally, your blood pressure should be below 120/80. Check your blood pressure at the same time every day. Avoid caffeine, alcohol, smoking, and exercise for 30 minutes prior to checking your blood pressure. These agents  can affect the accuracy of the blood pressure reading. This information is not intended to replace advice given to you by your health care provider. Make sure you discuss any questions you have with your health care provider. Document Revised: 09/12/2020 Document Reviewed: 09/12/2020 Elsevier Patient Education  2023 [employer].

## 2021-12-02 NOTE — Telephone Encounter (Signed)
Noted  

## 2021-12-03 DIAGNOSIS — F5104 Psychophysiologic insomnia: Secondary | ICD-10-CM | POA: Insufficient documentation

## 2021-12-03 DIAGNOSIS — J302 Other seasonal allergic rhinitis: Secondary | ICD-10-CM | POA: Insufficient documentation

## 2021-12-03 DIAGNOSIS — Z720 Tobacco use: Secondary | ICD-10-CM | POA: Insufficient documentation

## 2021-12-05 ENCOUNTER — Other Ambulatory Visit: Payer: Self-pay | Admitting: Nurse Practitioner

## 2021-12-05 DIAGNOSIS — R7303 Prediabetes: Secondary | ICD-10-CM

## 2021-12-08 NOTE — Telephone Encounter (Signed)
Requested medication (s) are due for refill today:   Yes  Requested medication (s) are on the active medication list:   Yes  Future visit scheduled:   Yes 02/11/2022   Last ordered: Courtesy refill given 11/03/2021 #60, 0 refills  Returned because courtesy refill has been given.   Provider to review for refills prior to upcoming appt.   Requested Prescriptions  Pending Prescriptions Disp Refills   metFORMIN (GLUCOPHAGE) 500 MG tablet [Pharmacy Med Name: metFORMIN HCl 500 MG Oral Tablet] 60 tablet 0    Sig: TAKE 1 TABLET BY MOUTH TWICE DAILY WITH A MEAL     Endocrinology:  Diabetes - Biguanides Failed - 12/05/2021  6:27 AM      Failed - Cr in normal range and within 360 days    Creatinine, Ser  Date Value Ref Range Status  06/19/2021 0.74 (L) 0.76 - 1.27 mg/dL Final         Failed - B12 Level in normal range and within 720 days    No results found for: "VITAMINB12"       Passed - HBA1C is between 0 and 7.9 and within 180 days    Hgb A1c MFr Bld  Date Value Ref Range Status  06/19/2021 6.0 (H) 4.8 - 5.6 % Final    Comment:             Prediabetes: 5.7 - 6.4          Diabetes: >6.4          Glycemic control for adults with diabetes: <7.0          Passed - eGFR in normal range and within 360 days    GFR calc Af Amer  Date Value Ref Range Status  07/26/2019 114 >59 mL/min/1.73 Final    Comment:    **[facility] currently reports eGFR in compliance with the current**   recommendations of the [employer]. [facility] will   update reporting as new guidelines are published from the NKF-ASN   Task force.    GFR calc non Af Amer  Date Value Ref Range Status  07/26/2019 99 >59 mL/min/1.73 Final   eGFR  Date Value Ref Range Status  06/19/2021 107 >59 mL/min/1.73 Final         Passed - Valid encounter within last 6 months    Recent Outpatient Visits           4 months ago Essential hypertension   [place], Vernia Buff, NP    5 months ago Essential hypertension   [facility] [place], [place], [place]   7 months ago Essential hypertension   [place], Vernia Buff, NP   1 year ago Essential hypertension   [place], [place], [place]   1 year ago Prediabetes   [facility] [place], [place], [place]       Future Appointments             In 2 months Gildardo Pounds, NP [place] within normal limits and completed in the last 12 months    WBC  Date Value Ref Range Status  06/19/2021 7.3 3.4 - 10.8 x10E3/uL Final  07/22/2017 12.1 (H) 4.0 - 10.5 K/uL Final   RBC  Date Value Ref Range  Status  06/19/2021 4.98 4.14 - 5.80 x10E6/uL Final  07/22/2017 4.93 4.22 - 5.81 MIL/uL Final   Hemoglobin  Date Value Ref Range Status  06/19/2021 15.0 13.0 - 17.7 g/dL Final   Hematocrit  Date Value Ref Range Status  06/19/2021 44.4 37.5 - 51.0 % Final   MCHC  Date Value Ref Range Status  06/19/2021 33.8 31.5 - 35.7 g/dL Final  07/22/2017 32.9 30.0 - 36.0 g/dL Final   [facility]  Date Value Ref Range Status  06/19/2021 30.1 26.6 - 33.0 pg Final  07/22/2017 29.2 26.0 - 34.0 pg Final   MCV  Date Value Ref Range Status  06/19/2021 89 79 - 97 fL Final   No results found for: "PLTCOUNTKUC", "LABPLAT", "POCPLA" RDW  Date Value Ref Range Status  06/19/2021 12.8 11.6 - 15.4 % Final

## 2021-12-11 ENCOUNTER — Other Ambulatory Visit: Payer: Self-pay | Admitting: Physician Assistant

## 2021-12-11 DIAGNOSIS — I2510 Atherosclerotic heart disease of native coronary artery without angina pectoris: Secondary | ICD-10-CM

## 2021-12-11 DIAGNOSIS — E785 Hyperlipidemia, unspecified: Secondary | ICD-10-CM

## 2021-12-11 DIAGNOSIS — I10 Essential (primary) hypertension: Secondary | ICD-10-CM

## 2021-12-15 ENCOUNTER — Ambulatory Visit: Payer: Self-pay

## 2021-12-15 NOTE — Telephone Encounter (Signed)
  Chief Complaint: Lower right abdomen pain Symptoms: above Frequency: unsure 2 weeks maybe more Pertinent Negatives: Patient denies Fever Disposition: [] ED /[] Urgent Care (no appt availability in office) / [x] Appointment(In office/virtual)/ []  [facility]/ [] Home Care/ [] Refused Recommended Disposition /[] [facility]/ []  Follow-up with PCP Additional Notes: PT states his memory is not good. Pt has pain lower right side of abdomen. PT was sick with URI a bit back , that was when pt noticed pain. PT went to Mobile unit bu t exam was limited d/t the chair not able to recline. Pain rsolves when laying down.    Reason for Disposition  Abdominal pain is a chronic symptom (recurrent or ongoing AND present > 4 weeks)  Answer Assessment - Initial Assessment Questions 1. LOCATION: "Where does it hurt?"      Lower right side 2. RADIATION: "Does the pain shoot anywhere else?" (e.g., chest, back)     Stays in that area 3. ONSET: "When did the pain begin?" (Minutes, hours or days ago)      Unsure -  4. SUDDEN: "Gradual or sudden onset?"     gradual 5. PATTERN "Does the pain come and go, or is it constant?"    - If it comes and goes: "How long does it last?" "Do you have pain now?"     (Note: Comes and goes means the pain is intermittent. It goes away completely between bouts.)    - If constant: "Is it getting better, staying the same, or getting worse?"      (Note: Constant means the pain never goes away completely; most serious pain is constant and gets worse.)      Hurts when pt cough, or when he stands up 6. SEVERITY: "How bad is the pain?"  (e.g., Scale 1-10; mild, moderate, or severe)    - MILD (1-3): Doesn't interfere with normal activities, abdomen soft and not tender to touch.     - MODERATE (4-7): Interferes with normal activities or awakens from sleep, abdomen tender to touch.     - SEVERE (8-10): Excruciating pain, doubled over, unable to do any normal activities.        6/10 7. RECURRENT SYMPTOM: "Have you ever had this type of stomach pain before?" If Yes, ask: "When was the last time?" and "What happened that time?"      no 8. CAUSE: "What do you think is causing the stomach pain?"     NO idea 9. RELIEVING/AGGRAVATING FACTORS: "What makes it better or worse?" (e.g., antacids, bending or twisting motion, bowel movement)     Laying down feels better 10. OTHER SYMPTOMS: "Do you have any other symptoms?" (e.g., back pain, diarrhea, fever, urination pain, vomiting)       Nausea  Protocols used: Abdominal Pain - Male-A-AH

## 2021-12-17 ENCOUNTER — Encounter: Payer: Self-pay | Admitting: Family Medicine

## 2021-12-17 ENCOUNTER — Ambulatory Visit: Payer: Self-pay | Attending: Family Medicine | Admitting: Family Medicine

## 2021-12-17 VITALS — BP 151/84 | HR 78 | Temp 97.8°F | Ht 72.0 in | Wt 236.4 lb

## 2021-12-17 DIAGNOSIS — I2510 Atherosclerotic heart disease of native coronary artery without angina pectoris: Secondary | ICD-10-CM

## 2021-12-17 DIAGNOSIS — R1031 Right lower quadrant pain: Secondary | ICD-10-CM

## 2021-12-17 DIAGNOSIS — I10 Essential (primary) hypertension: Secondary | ICD-10-CM

## 2021-12-17 MED ORDER — CARVEDILOL 6.25 MG PO TABS: 6.2500 mg | ORAL_TABLET | Freq: Two times a day (BID) | ORAL | 1 refills | Status: DC
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
## 2021-12-18 LAB — CBC WITH DIFFERENTIAL/PLATELET
Basophils Absolute: 0.1 10*3/uL (ref 0.0–0.2)
Basos: 1 %
EOS (ABSOLUTE): 0.2 10*3/uL (ref 0.0–0.4)
Eos: 3 %
Hematocrit: 44.2 % (ref 37.5–51.0)
Hemoglobin: 14.8 g/dL (ref 13.0–17.7)
Immature Grans (Abs): 0 10*3/uL (ref 0.0–0.1)
Immature Granulocytes: 0 %
Lymphocytes Absolute: 2 10*3/uL (ref 0.7–3.1)
Lymphs: 29 %
MCH: 29.1 pg (ref 26.6–33.0)
MCHC: 33.5 g/dL (ref 31.5–35.7)
MCV: 87 fL (ref 79–97)
Monocytes Absolute: 0.6 10*3/uL (ref 0.1–0.9)
Monocytes: 8 %
Neutrophils Absolute: 4 10*3/uL (ref 1.4–7.0)
Neutrophils: 59 %
Platelets: 300 10*3/uL (ref 150–450)
RBC: 5.08 x10E6/uL (ref 4.14–5.80)
RDW: 12.6 % (ref 11.6–15.4)
WBC: 6.9 10*3/uL (ref 3.4–10.8)

## 2021-12-18 LAB — CMP14+EGFR
ALT: 42 IU/L (ref 0–44)
AST: 31 IU/L (ref 0–40)
Albumin/Globulin Ratio: 1.7 (ref 1.2–2.2)
Albumin: 4.5 g/dL (ref 3.8–4.9)
Alkaline Phosphatase: 96 IU/L (ref 44–121)
BUN/Creatinine Ratio: 13 (ref 9–20)
BUN: 11 mg/dL (ref 6–24)
Bilirubin Total: 0.4 mg/dL (ref 0.0–1.2)
CO2: 22 mmol/L (ref 20–29)
Calcium: 10.1 mg/dL (ref 8.7–10.2)
Chloride: 104 mmol/L (ref 96–106)
Creatinine, Ser: 0.87 mg/dL (ref 0.76–1.27)
Globulin, Total: 2.7 g/dL (ref 1.5–4.5)
Glucose: 94 mg/dL (ref 70–99)
Potassium: 4.6 mmol/L (ref 3.5–5.2)
Sodium: 143 mmol/L (ref 134–144)
Total Protein: 7.2 g/dL (ref 6.0–8.5)
eGFR: 101 mL/min/{1.73_m2} (ref >59)

## 2022-01-08 ENCOUNTER — Ambulatory Visit (HOSPITAL_COMMUNITY): Payer: Self-pay

## 2022-01-15 ENCOUNTER — Other Ambulatory Visit: Payer: Self-pay | Admitting: Nurse Practitioner

## 2022-01-15 ENCOUNTER — Ambulatory Visit (HOSPITAL_COMMUNITY)
Admission: RE | Admit: 2022-01-15 | Discharge: 2022-01-15 | Disposition: A | Discharge status: Home / Self Care | Payer: Self-pay | Source: Ambulatory Visit | Attending: Family Medicine | Admitting: Family Medicine

## 2022-01-15 DIAGNOSIS — R1031 Right lower quadrant pain: Secondary | ICD-10-CM

## 2022-01-27 ENCOUNTER — Ambulatory Visit (HOSPITAL_COMMUNITY)
Admission: RE | Admit: 2022-01-27 | Discharge: 2022-01-27 | Disposition: A | Discharge status: Home / Self Care | Payer: Self-pay | Source: Ambulatory Visit | Attending: Nurse Practitioner | Admitting: Nurse Practitioner

## 2022-01-27 DIAGNOSIS — R1031 Right lower quadrant pain: Secondary | ICD-10-CM | POA: Insufficient documentation

## 2022-01-27 MED ORDER — IOHEXOL 350 MG/ML SOLN
75.0000 mL | Freq: Once | INTRAVENOUS | Status: AC | PRN
  Administered 2022-01-27: 75 mL via INTRAVENOUS

## 2022-01-28 ENCOUNTER — Other Ambulatory Visit: Payer: Self-pay | Admitting: Nurse Practitioner

## 2022-01-28 DIAGNOSIS — R1031 Right lower quadrant pain: Secondary | ICD-10-CM

## 2022-02-03 ENCOUNTER — Other Ambulatory Visit: Payer: Self-pay | Admitting: Physician Assistant

## 2022-02-03 DIAGNOSIS — I10 Essential (primary) hypertension: Secondary | ICD-10-CM

## 2022-02-03 DIAGNOSIS — I2510 Atherosclerotic heart disease of native coronary artery without angina pectoris: Secondary | ICD-10-CM

## 2022-02-03 NOTE — Telephone Encounter (Signed)
Dose dc'd 12/17/21 "reorder" dose changed Dr Margarita Rana  Requested Prescriptions  Refused Prescriptions Disp Refills   carvedilol (COREG) 3.125 MG tablet [Pharmacy Med Name: Carvedilol 3.125 MG Oral Tablet] 180 tablet 0    Sig: Take 1 tablet by mouth twice daily     Cardiovascular: Beta Blockers 3 Failed - 02/03/2022  6:23 AM      Failed - Last BP in normal range    BP Readings from Last 1 Encounters:  12/17/21 (!) 151/84         Passed - Cr in normal range and within 360 days    Creatinine, Ser  Date Value Ref Range Status  12/17/2021 0.87 0.76 - 1.27 mg/dL Final         Passed - AST in normal range and within 360 days    AST  Date Value Ref Range Status  12/17/2021 31 0 - 40 IU/L Final         Passed - ALT in normal range and within 360 days    ALT  Date Value Ref Range Status  12/17/2021 42 0 - 44 IU/L Final         Passed - Last Heart Rate in normal range    Pulse Readings from Last 1 Encounters:  12/17/21 78         Passed - Valid encounter within last 6 months    Recent Outpatient Visits           1 month ago Right lower quadrant pain   [place], Charlane Ferretti, MD   6 months ago Essential hypertension   [place], NP   7 months ago Essential hypertension   [place], [place], [place]   9 months ago Essential hypertension   [place], Vernia Buff, NP   1 year ago Essential hypertension   [place], [place], [place]       Future Appointments             In 1 week Gildardo Pounds, NP [place]

## 2022-02-11 ENCOUNTER — Ambulatory Visit: Payer: Self-pay | Attending: Nurse Practitioner | Admitting: Nurse Practitioner

## 2022-02-11 ENCOUNTER — Encounter: Payer: Self-pay | Admitting: Nurse Practitioner

## 2022-02-11 VITALS — BP 137/75 | HR 86 | Ht 72.0 in | Wt 241.0 lb

## 2022-02-11 DIAGNOSIS — T148XXA Other injury of unspecified body region, initial encounter: Secondary | ICD-10-CM

## 2022-02-11 DIAGNOSIS — I10 Essential (primary) hypertension: Secondary | ICD-10-CM

## 2022-02-11 DIAGNOSIS — I2510 Atherosclerotic heart disease of native coronary artery without angina pectoris: Secondary | ICD-10-CM

## 2022-02-11 DIAGNOSIS — R7303 Prediabetes: Secondary | ICD-10-CM

## 2022-02-11 DIAGNOSIS — F10129 Alcohol abuse with intoxication, unspecified: Secondary | ICD-10-CM

## 2022-02-11 DIAGNOSIS — E785 Hyperlipidemia, unspecified: Secondary | ICD-10-CM

## 2022-02-11 DIAGNOSIS — I5022 Chronic systolic (congestive) heart failure: Secondary | ICD-10-CM

## 2022-02-11 LAB — POCT GLYCOSYLATED HEMOGLOBIN (HGB A1C): HbA1c, POC (controlled diabetic range): 6.1 % (ref 0.0–7.0)

## 2022-02-11 LAB — GLUCOSE, POCT (MANUAL RESULT ENTRY): POC Glucose: 167 mg/dl — AB (ref 70–99)

## 2022-02-11 MED ORDER — CLOPIDOGREL BISULFATE 75 MG PO TABS: 75.0000 mg | ORAL_TABLET | Freq: Every day | ORAL | 1 refills | Status: DC

## 2022-02-11 MED ORDER — METFORMIN HCL 500 MG PO TABS: 500.0000 mg | ORAL_TABLET | Freq: Two times a day (BID) | ORAL | 1 refills | Status: DC

## 2022-02-11 MED ORDER — CARVEDILOL 3.125 MG PO TABS: 3.1250 mg | ORAL_TABLET | Freq: Two times a day (BID) | ORAL | 1 refills | Status: DC

## 2022-02-11 MED ORDER — LOSARTAN POTASSIUM 25 MG PO TABS: 25.0000 mg | ORAL_TABLET | Freq: Every day | ORAL | 1 refills | Status: DC

## 2022-02-11 MED ORDER — ATORVASTATIN CALCIUM 80 MG PO TABS: 80.0000 mg | ORAL_TABLET | Freq: Every day | ORAL | 1 refills | Status: DC

## 2022-02-11 MED ORDER — CYCLOBENZAPRINE HCL 10 MG PO TABS: 10.0000 mg | ORAL_TABLET | Freq: Three times a day (TID) | ORAL | 3 refills | Status: DC | PRN

## 2022-02-11 NOTE — Patient Instructions (Signed)
Placed in [place]. [address] [place], [place] [address] PH# [phone]
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
## 2022-02-11 NOTE — Progress Notes (Signed)
Next step with stomach issues

## 2022-05-13 ENCOUNTER — Ambulatory Visit: Payer: Self-pay | Attending: Nurse Practitioner | Admitting: Nurse Practitioner

## 2022-05-13 ENCOUNTER — Encounter: Payer: Self-pay | Admitting: Nurse Practitioner

## 2022-05-13 VITALS — BP 130/83 | HR 97 | Ht 72.0 in | Wt 235.8 lb

## 2022-05-13 DIAGNOSIS — T148XXA Other injury of unspecified body region, initial encounter: Secondary | ICD-10-CM

## 2022-05-13 DIAGNOSIS — I252 Old myocardial infarction: Secondary | ICD-10-CM

## 2022-05-13 DIAGNOSIS — I10 Essential (primary) hypertension: Secondary | ICD-10-CM

## 2022-05-13 DIAGNOSIS — L989 Disorder of the skin and subcutaneous tissue, unspecified: Secondary | ICD-10-CM

## 2022-05-13 DIAGNOSIS — E785 Hyperlipidemia, unspecified: Secondary | ICD-10-CM

## 2022-05-13 DIAGNOSIS — I2510 Atherosclerotic heart disease of native coronary artery without angina pectoris: Secondary | ICD-10-CM

## 2022-05-13 DIAGNOSIS — R7303 Prediabetes: Secondary | ICD-10-CM

## 2022-05-13 MED ORDER — CLOPIDOGREL BISULFATE 75 MG PO TABS: 75.0000 mg | ORAL_TABLET | Freq: Every day | ORAL | 1 refills | Status: DC

## 2022-05-13 MED ORDER — CYCLOBENZAPRINE HCL 10 MG PO TABS: 10.0000 mg | ORAL_TABLET | Freq: Three times a day (TID) | ORAL | 3 refills | Status: DC | PRN

## 2022-05-13 MED ORDER — LOSARTAN POTASSIUM 25 MG PO TABS: 25.0000 mg | ORAL_TABLET | Freq: Every day | ORAL | 1 refills | Status: DC

## 2022-05-13 MED ORDER — MUPIROCIN 2 % EX OINT: 1.0000 | TOPICAL_OINTMENT | Freq: Two times a day (BID) | CUTANEOUS | 0 refills | Status: DC

## 2022-05-13 MED ORDER — NITROGLYCERIN 0.4 MG SL SUBL: 0.4000 mg | SUBLINGUAL_TABLET | SUBLINGUAL | 2 refills | Status: DC | PRN

## 2022-05-13 MED ORDER — CARVEDILOL 3.125 MG PO TABS: 3.1250 mg | ORAL_TABLET | Freq: Two times a day (BID) | ORAL | 1 refills | Status: DC

## 2022-05-13 MED ORDER — SULFAMETHOXAZOLE-TRIMETHOPRIM 800-160 MG PO TABS: 1.0000 | ORAL_TABLET | Freq: Two times a day (BID) | ORAL | 0 refills | Status: DC

## 2022-05-13 MED ORDER — METFORMIN HCL 500 MG PO TABS: 500.0000 mg | ORAL_TABLET | Freq: Two times a day (BID) | ORAL | 1 refills | Status: DC

## 2022-05-13 MED ORDER — ATORVASTATIN CALCIUM 80 MG PO TABS: 80.0000 mg | ORAL_TABLET | Freq: Every day | ORAL | 1 refills | Status: DC
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
## 2022-05-13 NOTE — Progress Notes (Signed)
Boil on back that is not healing

## 2022-05-14 LAB — CMP14+EGFR
ALT: 43 IU/L (ref 0–44)
AST: 29 IU/L (ref 0–40)
Albumin/Globulin Ratio: 2 (ref 1.2–2.2)
Albumin: 4.6 g/dL (ref 3.8–4.9)
Alkaline Phosphatase: 100 IU/L (ref 44–121)
BUN/Creatinine Ratio: 15 (ref 9–20)
BUN: 13 mg/dL (ref 6–24)
Bilirubin Total: 0.5 mg/dL (ref 0.0–1.2)
CO2: 21 mmol/L (ref 20–29)
Calcium: 9.8 mg/dL (ref 8.7–10.2)
Chloride: 104 mmol/L (ref 96–106)
Creatinine, Ser: 0.89 mg/dL (ref 0.76–1.27)
Globulin, Total: 2.3 g/dL (ref 1.5–4.5)
Glucose: 126 mg/dL — ABNORMAL HIGH (ref 70–99)
Potassium: 4.3 mmol/L (ref 3.5–5.2)
Sodium: 141 mmol/L (ref 134–144)
Total Protein: 6.9 g/dL (ref 6.0–8.5)
eGFR: 101 mL/min/{1.73_m2} (ref >59)

## 2022-06-04 ENCOUNTER — Encounter: Payer: Self-pay | Admitting: Family Medicine

## 2023-02-15 ENCOUNTER — Ambulatory Visit: Payer: Self-pay | Admitting: Nurse Practitioner

## 2023-02-15 ENCOUNTER — Other Ambulatory Visit: Payer: Self-pay | Admitting: Nurse Practitioner

## 2023-02-15 DIAGNOSIS — I2510 Atherosclerotic heart disease of native coronary artery without angina pectoris: Secondary | ICD-10-CM

## 2023-02-15 DIAGNOSIS — I10 Essential (primary) hypertension: Secondary | ICD-10-CM

## 2023-02-15 NOTE — Telephone Encounter (Signed)
Patient initially called due to migraines and message was sent to nurse triage pool. Attempts have been made 3 times to contact patient. Unable to reach patient and voicemail is full.

## 2023-02-15 NOTE — Telephone Encounter (Signed)
 Letter sent.

## 2023-02-18 ENCOUNTER — Other Ambulatory Visit: Payer: Self-pay | Admitting: Family Medicine

## 2023-02-18 DIAGNOSIS — T148XXA Other injury of unspecified body region, initial encounter: Secondary | ICD-10-CM

## 2023-02-18 DIAGNOSIS — I10 Essential (primary) hypertension: Secondary | ICD-10-CM

## 2023-02-18 DIAGNOSIS — I2510 Atherosclerotic heart disease of native coronary artery without angina pectoris: Secondary | ICD-10-CM

## 2023-02-18 DIAGNOSIS — E785 Hyperlipidemia, unspecified: Secondary | ICD-10-CM

## 2023-02-18 DIAGNOSIS — I252 Old myocardial infarction: Secondary | ICD-10-CM

## 2023-02-18 NOTE — Telephone Encounter (Signed)
 Copied from CRM [id number]. Topic: Clinical - Medication Refill >> Feb 18, 2023 12:13 PM Graeme ORN wrote: Most Recent Primary Care Visit:  Provider: THEOTIS OHM W  Department: [facility]  Visit Type: OFFICE VISIT  Date: 05/13/2022  Medication:  clopidogrel  (PLAVIX ) 75 MG tablet cyclobenzaprine  (FLEXERIL ) 10 MG tablet losartan  (COZAAR ) 25 MG tablet nitroGLYCERIN  (NITROSTAT ) 0.4 MG SL tablet atorvastatin  (LIPITOR ) 80 MG tablet  Has the patient contacted their pharmacy? Yes patient switched pharmacy were supposed to request  (Agent: If no, request that the patient contact the pharmacy for the refill. If patient does not wish to contact the pharmacy document the reason why and proceed with request.) (Agent: If yes, when and what did the pharmacy advise?)  Is this the correct pharmacy for this prescription? Yes If no, delete pharmacy and type the correct one.  This is the patient's preferred pharmacy:   [facility] [address] [place] - [address] [place] [address] Phone: [phone] Fax: [phone]   Has the prescription been filled recently? No  Is the patient out of the medication? Yes  Has the patient been seen for an appointment in the last year OR does the patient have an upcoming appointment? Yes last 05/13/2022 schedule next for 1st available 03/24/2023  Can we respond through MyChart? No  Agent: Please be advised that Rx refills may take up to 3 business days. We ask that you follow-up with your pharmacy.

## 2023-02-19 MED ORDER — CYCLOBENZAPRINE HCL 10 MG PO TABS: 10.0000 mg | ORAL_TABLET | Freq: Three times a day (TID) | ORAL | 0 refills | Status: DC | PRN

## 2023-02-19 MED ORDER — ATORVASTATIN CALCIUM 80 MG PO TABS: 80.0000 mg | ORAL_TABLET | Freq: Every day | ORAL | 0 refills | Status: DC

## 2023-02-19 MED ORDER — NITROGLYCERIN 0.4 MG SL SUBL: 0.4000 mg | SUBLINGUAL_TABLET | SUBLINGUAL | 0 refills | Status: DC | PRN

## 2023-02-19 MED ORDER — CLOPIDOGREL BISULFATE 75 MG PO TABS: 75.0000 mg | ORAL_TABLET | Freq: Every day | ORAL | 0 refills | Status: DC

## 2023-02-19 MED ORDER — LOSARTAN POTASSIUM 25 MG PO TABS: 25.0000 mg | ORAL_TABLET | Freq: Every day | ORAL | 0 refills | Status: DC

## 2023-03-24 ENCOUNTER — Encounter: Payer: Self-pay | Admitting: Nurse Practitioner

## 2023-03-24 ENCOUNTER — Ambulatory Visit: Payer: Self-pay | Attending: Nurse Practitioner | Admitting: Nurse Practitioner

## 2023-03-24 VITALS — BP 151/90 | HR 96 | Resp 19 | Ht 72.0 in | Wt 236.0 lb

## 2023-03-24 DIAGNOSIS — T148XXA Other injury of unspecified body region, initial encounter: Secondary | ICD-10-CM

## 2023-03-24 DIAGNOSIS — E785 Hyperlipidemia, unspecified: Secondary | ICD-10-CM

## 2023-03-24 DIAGNOSIS — S39011S Strain of muscle, fascia and tendon of abdomen, sequela: Secondary | ICD-10-CM

## 2023-03-24 DIAGNOSIS — D72829 Elevated white blood cell count, unspecified: Secondary | ICD-10-CM

## 2023-03-24 DIAGNOSIS — R7303 Prediabetes: Secondary | ICD-10-CM

## 2023-03-24 DIAGNOSIS — L409 Psoriasis, unspecified: Secondary | ICD-10-CM

## 2023-03-24 DIAGNOSIS — I2510 Atherosclerotic heart disease of native coronary artery without angina pectoris: Secondary | ICD-10-CM

## 2023-03-24 DIAGNOSIS — I10 Essential (primary) hypertension: Secondary | ICD-10-CM

## 2023-03-24 MED ORDER — CYCLOBENZAPRINE HCL 10 MG PO TABS: 10.0000 mg | ORAL_TABLET | Freq: Three times a day (TID) | ORAL | 6 refills | Status: DC | PRN

## 2023-03-24 MED ORDER — CLOPIDOGREL BISULFATE 75 MG PO TABS: 75.0000 mg | ORAL_TABLET | Freq: Every day | ORAL | 1 refills | Status: DC

## 2023-03-24 MED ORDER — LOSARTAN POTASSIUM 25 MG PO TABS: 25.0000 mg | ORAL_TABLET | Freq: Every day | ORAL | 6 refills | Status: DC

## 2023-03-24 MED ORDER — ASPIRIN 81 MG PO CHEW: 81.0000 mg | CHEWABLE_TABLET | Freq: Every day | ORAL | 3 refills | Status: AC

## 2023-03-24 MED ORDER — ATORVASTATIN CALCIUM 80 MG PO TABS: 80.0000 mg | ORAL_TABLET | Freq: Every day | ORAL | 6 refills | Status: DC

## 2023-03-24 MED ORDER — LOSARTAN POTASSIUM 25 MG PO TABS: 25.0000 mg | ORAL_TABLET | Freq: Every day | ORAL | 1 refills | Status: DC

## 2023-03-24 MED ORDER — CARVEDILOL 3.125 MG PO TABS: 3.1250 mg | ORAL_TABLET | Freq: Two times a day (BID) | ORAL | 6 refills | Status: DC

## 2023-03-24 MED ORDER — CLOPIDOGREL BISULFATE 75 MG PO TABS: 75.0000 mg | ORAL_TABLET | Freq: Every day | ORAL | 6 refills | Status: DC

## 2023-03-24 MED ORDER — CARVEDILOL 3.125 MG PO TABS: 3.1250 mg | ORAL_TABLET | Freq: Two times a day (BID) | ORAL | 1 refills | Status: DC

## 2023-03-24 MED ORDER — MOMETASONE FUROATE 0.1 % EX OINT: TOPICAL_OINTMENT | Freq: Every day | CUTANEOUS | 3 refills | Status: DC

## 2023-03-24 MED ORDER — ATORVASTATIN CALCIUM 80 MG PO TABS: 80.0000 mg | ORAL_TABLET | Freq: Every day | ORAL | 1 refills | Status: DC
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
## 2023-03-25 LAB — CBC WITH DIFFERENTIAL/PLATELET
Basophils Absolute: 0.1 10*3/uL (ref 0.0–0.2)
Basos: 1 %
EOS (ABSOLUTE): 0.2 10*3/uL (ref 0.0–0.4)
Eos: 3 %
Hematocrit: 45 % (ref 37.5–51.0)
Hemoglobin: 15.5 g/dL (ref 13.0–17.7)
Immature Grans (Abs): 0 10*3/uL (ref 0.0–0.1)
Immature Granulocytes: 0 %
Lymphocytes Absolute: 1.8 10*3/uL (ref 0.7–3.1)
Lymphs: 22 %
MCH: 31.3 pg (ref 26.6–33.0)
MCHC: 34.4 g/dL (ref 31.5–35.7)
MCV: 91 fL (ref 79–97)
Monocytes Absolute: 0.5 10*3/uL (ref 0.1–0.9)
Monocytes: 7 %
Neutrophils Absolute: 5.5 10*3/uL (ref 1.4–7.0)
Neutrophils: 67 %
Platelets: 219 10*3/uL (ref 150–450)
RBC: 4.96 x10E6/uL (ref 4.14–5.80)
RDW: 13.6 % (ref 11.6–15.4)
WBC: 8.2 10*3/uL (ref 3.4–10.8)

## 2023-03-25 LAB — CMP14+EGFR
ALT: 66 IU/L — ABNORMAL HIGH (ref 0–44)
AST: 65 IU/L — ABNORMAL HIGH (ref 0–40)
Albumin: 4.3 g/dL (ref 3.8–4.9)
Alkaline Phosphatase: 126 IU/L — ABNORMAL HIGH (ref 44–121)
BUN/Creatinine Ratio: 11 (ref 9–20)
BUN: 9 mg/dL (ref 6–24)
Bilirubin Total: 0.2 mg/dL (ref 0.0–1.2)
CO2: 20 mmol/L (ref 20–29)
Calcium: 9.3 mg/dL (ref 8.7–10.2)
Chloride: 105 mmol/L (ref 96–106)
Creatinine, Ser: 0.8 mg/dL (ref 0.76–1.27)
Globulin, Total: 2.6 g/dL (ref 1.5–4.5)
Glucose: 165 mg/dL — ABNORMAL HIGH (ref 70–99)
Potassium: 4.2 mmol/L (ref 3.5–5.2)
Sodium: 142 mmol/L (ref 134–144)
Total Protein: 6.9 g/dL (ref 6.0–8.5)
eGFR: 103 mL/min/{1.73_m2} (ref >59)

## 2023-03-25 LAB — LIPID PANEL
Chol/HDL Ratio: 5.4 ratio — ABNORMAL HIGH (ref 0.0–5.0)
Cholesterol, Total: 247 mg/dL — ABNORMAL HIGH (ref 100–199)
HDL: 46 mg/dL (ref >39)
Triglycerides: 907 mg/dL (ref 0–149)

## 2023-03-25 LAB — HEMOGLOBIN A1C
Est. average glucose Bld gHb Est-mCnc: 128 mg/dL
Hgb A1c MFr Bld: 6.1 % — ABNORMAL HIGH (ref 4.8–5.6)

## 2023-03-27 ENCOUNTER — Other Ambulatory Visit: Payer: Self-pay | Admitting: Nurse Practitioner

## 2023-03-27 DIAGNOSIS — E781 Pure hyperglyceridemia: Secondary | ICD-10-CM

## 2023-03-30 ENCOUNTER — Telehealth: Payer: Self-pay | Admitting: Nurse Practitioner

## 2023-03-30 NOTE — Telephone Encounter (Signed)
 error

## 2023-09-24 ENCOUNTER — Ambulatory Visit: Payer: Self-pay | Admitting: Nurse Practitioner

## 2023-10-15 ENCOUNTER — Ambulatory Visit: Payer: Self-pay

## 2023-10-15 ENCOUNTER — Other Ambulatory Visit: Payer: Self-pay | Admitting: Nurse Practitioner

## 2023-10-15 DIAGNOSIS — S39011S Strain of muscle, fascia and tendon of abdomen, sequela: Secondary | ICD-10-CM

## 2023-10-15 DIAGNOSIS — I2510 Atherosclerotic heart disease of native coronary artery without angina pectoris: Secondary | ICD-10-CM

## 2023-10-15 DIAGNOSIS — E785 Hyperlipidemia, unspecified: Secondary | ICD-10-CM

## 2023-10-15 DIAGNOSIS — I10 Essential (primary) hypertension: Secondary | ICD-10-CM

## 2023-10-15 DIAGNOSIS — I252 Old myocardial infarction: Secondary | ICD-10-CM

## 2023-10-15 DIAGNOSIS — L409 Psoriasis, unspecified: Secondary | ICD-10-CM

## 2023-10-15 MED ORDER — CARVEDILOL 3.125 MG PO TABS: 3.1250 mg | ORAL_TABLET | Freq: Two times a day (BID) | ORAL | 0 refills | Status: DC

## 2023-10-15 MED ORDER — LOSARTAN POTASSIUM 25 MG PO TABS: 25.0000 mg | ORAL_TABLET | Freq: Every day | ORAL | 0 refills | Status: DC

## 2023-10-15 MED ORDER — NITROGLYCERIN 0.4 MG SL SUBL
0.4000 mg | SUBLINGUAL_TABLET | SUBLINGUAL | 0 refills | Status: AC | PRN
Start: 2023-10-15 — End: ?

## 2023-10-15 MED ORDER — ATORVASTATIN CALCIUM 80 MG PO TABS: 80.0000 mg | ORAL_TABLET | Freq: Every day | ORAL | 5 refills | Status: AC

## 2023-10-15 MED ORDER — CLOPIDOGREL BISULFATE 75 MG PO TABS: 75.0000 mg | ORAL_TABLET | Freq: Every day | ORAL | 0 refills | Status: DC

## 2023-10-15 NOTE — Telephone Encounter (Signed)
 Copied from CRM #[id number]. Topic: Clinical - Medication Refill >> Oct 15, 2023 10:19 AM Willma R wrote: Medication:  aspirin  81 MG chewable tablet  atorvastatin  (LIPITOR ) 80 MG tablet carvedilol  (COREG ) 3.125 MG tablet  clopidogrel  (PLAVIX ) 75 MG tablet  cyclobenzaprine  (FLEXERIL ) 10 MG tablet  losartan  (COZAAR ) 25 MG tablet  mometasone  (ELOCON ) 0.1 % ointment  nitroGLYCERIN  (NITROSTAT ) 0.4 MG SL tablet  Has the patient contacted their pharmacy? Yes, call dr  This is the patient's preferred pharmacy:  [facility] [address] [place] - [address] [place] [address] Phone: [phone] Fax: [phone]  Is this the correct pharmacy for this prescription? Yes If no, delete pharmacy and type the correct one.   Has the prescription been filled recently? No  Is the patient out of the medication? Yes  Has the patient been seen for an appointment in the last year OR does the patient have an upcoming appointment? Yes  Can we respond through MyChart? No  Agent: Please be advised that Rx refills may take up to 3 business days. We ask that you follow-up with your pharmacy.

## 2023-10-15 NOTE — Telephone Encounter (Signed)
 Requested medications are due for refill today.  yes  Requested medications are on the active medications list.  yes  Last refill. Cyclobenzaprine  03/24/2023 #30 6 rf, Mometasone  03/24/2023 45g 3 rf  Future visit scheduled.   no  Notes to clinic.  Please review for refill.    Requested Prescriptions  Pending Prescriptions Disp Refills   cyclobenzaprine  (FLEXERIL ) 10 MG tablet 30 tablet 6    Sig: Take 1 tablet (10 mg total) by mouth 3 (three) times daily as needed for muscle spasms.     Not Delegated - Analgesics:  Muscle Relaxants Failed - 10/15/2023  5:49 PM      Failed - This refill cannot be delegated      Failed - Valid encounter within last 6 months    Recent Outpatient Visits           6 months ago Primary hypertension   [facility] - [facility] Dept Of [place]. [facility] Theotis Haze ORN, NP   1 year ago Primary hypertension   [facility] D[facility] - A Dept Of [place]. [facility] Theotis Haze ORN, NP   1 year ago Essential hypertension   [facility] L[facility] - A Dept Of [place]. [facility] Theotis Haze ORN, NP   1 year ago Right lower quadrant pain   [facility] [place] - A Dept Of [place]. [facility] Delbert Clam, MD   2 years ago Essential hypertension   [facility] Alvin - A Dept Of St. Rose. [facility] [place], Zelda W, NP               mometasone  (ELOCON ) 0.1 % ointment 45 g 3    Sig: Apply topically daily. APPLY TO AREAS OF PATCHY SKIN     Off-Protocol Failed - 10/15/2023  5:49 PM      Failed - Medication not assigned to a protocol, review manually.      Passed - Valid encounter within last 12 months    Recent Outpatient Visits           6 months ago Primary hypertension   [facility] - A Dept Of [place]. [facility] Theotis Haze ORN, NP   1 year ago Primary hypertension   [facility]  Gree[place] Hill - A Dept Of [place]. [facility] Theotis Haze ORN, NP   1 year ago Essential hypertension   [facility] - A Dept Of Winter. [facility] Theotis Haze ORN, NP   1 year ago Right lower quadrant pain   [facility] [place] - A Dept Of [place]. [facility] Delbert Clam, MD   2 years ago Essential hypertension   [facility] G[facility] - A Dept Of [place]. [facility] Theotis Haze ORN, NP              Signed Prescriptions Disp Refills   atorvastatin  (LIPITOR ) 80 MG tablet 30 tablet 5    Sig: Take 1 tablet (80 mg total) by mouth daily.     Cardiovascular:  Antilipid - Statins Failed - 10/15/2023  5:49 PM      Failed - Lipid Panel in normal range within the last 12 months    Cholesterol, Total  Date Value Ref Range Status  03/24/2023 247 (H) 100 - 199 mg/dL Final  LDL Chol Calc (NIH)  Date Value Ref Range Status  03/24/2023 Comment (A) 0 - 99 mg/dL Final    Comment:    Triglyceride result indicated is too high for an accurate LDL cholesterol estimation.    HDL  Date Value Ref Range Status  03/24/2023 46 >39 mg/dL Final   Triglycerides  Date Value Ref Range Status  03/24/2023 907 (HH) 0 - 149 mg/dL Final    Comment:    Results confirmed on dilution.          Passed - Patient is not pregnant      Passed - Valid encounter within last 12 months    Recent Outpatient Visits           6 months ago Primary hypertension   [facility] - A Dept Of [place]. [facility] Theotis Haze ORN, NP   1 year ago Primary hypertension   [facility] - A Dept Of [place]. [facility] Theotis Haze ORN, NP   1 year ago Essential hypertension   [facility] - A Dept Of [place]. [facility] Theotis Haze ORN, NP   1 year ago Right lower quadrant pain   [facility] [place] - A Dept Of [place]. [facility] Delbert Clam, MD   2 years ago Essential hypertension   [facility] Bad[facility] - A Dept Of [place]. [facility] [place], Zelda W, NP               carvedilol  (COREG ) 3.125 MG tablet 60 tablet 0    Sig: Take 1 tablet (3.125 mg total) by mouth 2 (two) times daily.     Cardiovascular: Beta Blockers 3 Failed - 10/15/2023  5:49 PM      Failed - AST in normal range and within 360 days    AST  Date Value Ref Range Status  03/24/2023 65 (H) 0 - 40 IU/L Final         Failed - ALT in normal range and within 360 days    ALT  Date Value Ref Range Status  03/24/2023 66 (H) 0 - 44 IU/L Final         Failed - Last BP in normal range    BP Readings from Last 1 Encounters:  03/24/23 (!) 151/90         Failed - Valid encounter within last 6 months    Recent Outpatient Visits           6 months ago Primary hypertension   [facility] - A Dept Of [place]. [facility] Theotis Haze ORN, NP   1 year ago Primary hypertension   [facility] [facility] - A Dept Of [place]. [facility] Theotis Haze ORN, NP   1 year ago Essential hypertension   [facility] Piedmont - A Dept Of [place]. [facility] Theotis Haze ORN, NP   1 year ago Right lower quadrant pain   [place] [facility] - A Dept Of [place]. [facility] Delbert Clam, MD   2 years ago Essential hypertension   [facility] Springs - A Dept Of [place]. [facility] [place], [place] W, NP              Passed - Cr in normal range and within  360 days    Creatinine, Ser  Date Value Ref Range Status  03/24/2023 0.80 0.76 - 1.27 mg/dL Final         Passed - Last Heart Rate in normal range    Pulse Readings from Last 1 Encounters:  03/24/23 96          clopidogrel  (PLAVIX ) 75 MG tablet 30 tablet 0    Sig: Take 1 tablet (75 mg total) by mouth daily.     Hematology: Antiplatelets  - clopidogrel  Failed - 10/15/2023  5:49 PM      Failed - HCT in normal range and within 180 days    Hematocrit  Date Value Ref Range Status  03/24/2023 45.0 37.5 - 51.0 % Final         Failed - HGB in normal range and within 180 days    Hemoglobin  Date Value Ref Range Status  03/24/2023 15.5 13.0 - 17.7 g/dL Final         Failed - PLT in normal range and within 180 days    Platelets  Date Value Ref Range Status  03/24/2023 219 150 - 450 x10E3/uL Final         Failed - Valid encounter within last 6 months    Recent Outpatient Visits           6 months ago Primary hypertension   [facility] - A Dept Of [place]. [facility] Theotis Haze ORN, NP   1 year ago Primary hypertension   [facility] [place] [facility] - A Dept Of [place]. [facility] Theotis Haze ORN, NP   1 year ago Essential hypertension   [place] Upper [facility] - A Dept Of [place]. [facility] Theotis Haze ORN, NP   1 year ago Right lower quadrant pain   [facility] Norwood - A Dept Of P[place]. [facility] Delbert Clam, MD   2 years ago Essential hypertension   [facility] [facility] [place], [place] [facility] - A Dept Of Rhe[place]. [facility] [place], [place] W, NP              Passed - Cr in normal range and within 360 days    Creatinine, Ser  Date Value Ref Range Status  03/24/2023 0.80 0.76 - 1.27 mg/dL Final          losartan  (COZAAR ) 25 MG tablet 30 tablet 0    Sig: Take 1 tablet (25 mg total) by mouth daily.     Cardiovascular:  Angiotensin Receptor Blockers Failed - 10/15/2023  5:49 PM      Failed - Cr in normal range and within 180 days    Creatinine, Ser  Date Value Ref Range Status  03/24/2023 0.80 0.76 - 1.27 mg/dL Final         Failed - K in normal range and within 180 days    Potassium  Date Value Ref Range Status  03/24/2023 4.2 3.5 - 5.2 mmol/L Final         Failed - Last BP in normal range    BP  Readings from Last 1 Encounters:  03/24/23 (!) 151/90         Failed - Valid encounter within last 6 months    Recent Outpatient Visits           6 months ago Primary hypertension   [facility] - A Dept Of Thur[place]  [facility] Theotis Haze ORN, NP   1 year ago Primary hypertension   [facility] [place] - A Dept Of [place]. [facility] Theotis Haze ORN, NP   1 year ago Essential hypertension   [place] [facility] Loom[facility] - A Dept Of [place]. [facility] Theotis Haze ORN, NP   1 year ago Right lower quadrant pain   [place] [facility] Lyman - A Dept Of [place]. [facility] Delbert Clam, MD   2 years ago Essential hypertension   [facility] Clarks[place] - A Dept Of [place]. [facility] [place], Haze ORN, [place]              Passed - Patient is not pregnant       nitroGLYCERIN  (NITROSTAT ) 0.4 MG SL tablet 25 tablet 0    Sig: Place 1 tablet (0.4 mg total) under the tongue every 5 (five) minutes x 3 doses as needed for chest pain.     Cardiovascular:  Nitrates Failed - 10/15/2023  5:49 PM      Failed - Last BP in normal range    BP Readings from Last 1 Encounters:  03/24/23 (!) 151/90         Passed - Last Heart Rate in normal range    Pulse Readings from Last 1 Encounters:  03/24/23 96         Passed - Valid encounter within last 12 months    Recent Outpatient Visits           6 months ago Primary hypertension   [facility] - A Dept Of [place]. [facility] Theotis Haze ORN, NP   1 year ago Primary hypertension   [facility] - A Dept Of [place]. [facility] Theotis Haze ORN, NP   1 year ago Essential hypertension   [place] [facility] F[facility] City - A Dept Of [place]. [facility] Theotis Haze ORN, NP   1 year ago Right lower quadrant pain   [facility]right [facility] Osceola - A Dept Of [place]. [facility] Delbert Clam, MD   2 years ago Essential hypertension   [place] [facility] Bertram - A Dept Of [place]. [facility] [place], [place], NP              Refused Prescriptions Disp Refills   aspirin  81 MG chewable tablet 90 tablet 3    Sig: Chew 1 tablet (81 mg total) by mouth daily.     Analgesics:  NSAIDS - aspirin  Passed - 10/15/2023  5:49 PM      Passed - Cr in normal range and within 360 days    Creatinine, Ser  Date Value Ref Range Status  03/24/2023 0.80 0.76 - 1.27 mg/dL Final         Passed - eGFR is 10 or above and within 360 days    GFR calc Af Amer  Date Value Ref Range Status  07/26/2019 114 >59 mL/min/1.73 Final    Comment:    **[facility] currently reports eGFR in compliance with the current**   recommendations of the [employer]. [facility] will   update reporting as new guidelines are published from the NKF-ASN   Task force.    GFR calc non Af Amer  Date Value Ref Range Status  07/26/2019 99 >59 mL/min/1.73 Final  eGFR  Date Value Ref Range Status  03/24/2023 103 >59 mL/min/1.73 Final         Passed - Patient is not pregnant      Passed - Valid encounter within last 12 months    Recent Outpatient Visits           6 months ago Primary hypertension   [place] [facility] [place] [facility] - A Dept Of [place]. [facility] Theotis Haze ORN, NP   1 year ago Primary hypertension   [place] [facility] Carl[facility] - A Dept Of [place]. [facility] Theotis Haze ORN, NP   1 year ago Essential hypertension   [facility] [facility] College - A Dept Of [place]. [facility] Theotis Haze ORN, NP   1 year ago Right lower quadrant pain   [facility] Brookt[facility] - A Dept Of Percival. [facility] Delbert Clam, MD   2 years ago Essential hypertension   [facility] [place] Buffalo - A Dept Of [place]. [facility] Theotis Haze ORN, NP               hw

## 2023-10-15 NOTE — Telephone Encounter (Signed)
 Requested Prescriptions  Pending Prescriptions Disp Refills   atorvastatin  (LIPITOR ) 80 MG tablet 30 tablet 5    Sig: Take 1 tablet (80 mg total) by mouth daily.     Cardiovascular:  Antilipid - Statins Failed - 10/15/2023  5:47 PM      Failed - Lipid Panel in normal range within the last 12 months    Cholesterol, Total  Date Value Ref Range Status  03/24/2023 247 (H) 100 - 199 mg/dL Final   LDL Chol Calc (NIH)  Date Value Ref Range Status  03/24/2023 Comment (A) 0 - 99 mg/dL Final    Comment:    Triglyceride result indicated is too high for an accurate LDL cholesterol estimation.    HDL  Date Value Ref Range Status  03/24/2023 46 >39 mg/dL Final   Triglycerides  Date Value Ref Range Status  03/24/2023 907 (HH) 0 - 149 mg/dL Final    Comment:    Results confirmed on dilution.          Passed - Patient is not pregnant      Passed - Valid encounter within last 12 months    Recent Outpatient Visits           6 months ago Primary hypertension   [facility] [facility] - A Dept Of [place]. [facility] Theotis Haze ORN, NP   1 year ago Primary hypertension   [facility] Sycamore - A Dept Of [place]. [facility] Theotis Haze ORN, NP   1 year ago Essential hypertension   [place] [facility] Newell - A Dept Of [place]. [facility] Theotis Haze ORN, NP   1 year ago Right lower quadrant pain   [place] [facility] Cor[place] - A Dept Of Calcium. [facility] Delbert Clam, MD   2 years ago Essential hypertension   [facility] [facility] Hackberry - A Dept Of [place]. [facility] [place], Zelda W, NP               carvedilol  (COREG ) 3.125 MG tablet 60 tablet 0    Sig: Take 1 tablet (3.125 mg total) by mouth 2 (two) times daily.     Cardiovascular: Beta Blockers 3 Failed - 10/15/2023  5:47 PM      Failed - AST in normal range and within 360 days    AST  Date Value Ref Range Status   03/24/2023 65 (H) 0 - 40 IU/L Final         Failed - ALT in normal range and within 360 days    ALT  Date Value Ref Range Status  03/24/2023 66 (H) 0 - 44 IU/L Final         Failed - Last BP in normal range    BP Readings from Last 1 Encounters:  03/24/23 (!) 151/90         Failed - Valid encounter within last 6 months    Recent Outpatient Visits           6 months ago Primary hypertension   [facility] Springs - A Dept Of [place]. [facility] Theotis Haze ORN, NP   1 year ago Primary hypertension   [facility] Leavenworth - A Dept Of . [facility] Theotis Haze ORN, NP   1 year ago Essential hypertension   [facility] Colfax - A Dept Of Jolynn  [facility] Theotis Haze ORN, NP   1 year ago Right lower quadrant pain   [place] [facility] Ormsby - A Dept Of [place]o. [facility] Delbert Clam, MD   2 years ago Essential hypertension   [place] [facility] Progreso Lakes - A Dept Of [place]. [facility] [place], [place] W, NP              Passed - Cr in normal range and within 360 days    Creatinine, Ser  Date Value Ref Range Status  03/24/2023 0.80 0.76 - 1.27 mg/dL Final         Passed - Last Heart Rate in normal range    Pulse Readings from Last 1 Encounters:  03/24/23 96          clopidogrel  (PLAVIX ) 75 MG tablet 30 tablet 0    Sig: Take 1 tablet (75 mg total) by mouth daily.     Hematology: Antiplatelets - clopidogrel  Failed - 10/15/2023  5:47 PM      Failed - HCT in normal range and within 180 days    Hematocrit  Date Value Ref Range Status  03/24/2023 45.0 37.5 - 51.0 % Final         Failed - HGB in normal range and within 180 days    Hemoglobin  Date Value Ref Range Status  03/24/2023 15.5 13.0 - 17.7 g/dL Final         Failed - PLT in normal range and within 180 days    Platelets  Date Value Ref Range Status  03/24/2023 219 150 - 450 x10E3/uL Final          Failed - Valid encounter within last 6 months    Recent Outpatient Visits           6 months ago Primary hypertension   [facility] - A Dept Of [place]. [facility] Theotis Haze ORN, NP   1 year ago Primary hypertension   [place] [facility] Tuttle - A Dept Of [place]. [facility] Theotis Haze ORN, NP   1 year ago Essential hypertension   [facility] [facility] State - A Dept Of [place]. [facility] Theotis Haze ORN, NP   1 year ago Right lower quadrant pain   [facility] Lac[place] - A Dept Of West[place]. [facility] Delbert Clam, MD   2 years ago Essential hypertension   [facility] [facility] Ernest - A Dept Of [place]. [facility] [place], [place] W, NP              Passed - Cr in normal range and within 360 days    Creatinine, Ser  Date Value Ref Range Status  03/24/2023 0.80 0.76 - 1.27 mg/dL Final          cyclobenzaprine  (FLEXERIL ) 10 MG tablet 30 tablet 6    Sig: Take 1 tablet (10 mg total) by mouth 3 (three) times daily as needed for muscle spasms.     Not Delegated - Analgesics:  Muscle Relaxants Failed - 10/15/2023  5:47 PM      Failed - This refill cannot be delegated      Failed - Valid encounter within last 6 months    Recent Outpatient Visits           6 months ago Primary hypertension   [facility] [facility] Mountain Lake -  A Dept Of [place]. [facility] Theotis Haze ORN, NP   1 year ago Primary hypertension   [facility]anger [facility] Ray[facility]ham Center - A Dept Of [place]. [facility] Theotis Haze ORN, NP   1 year ago Essential hypertension   Falls [facility] Allegan - A Dept Of [place]. [facility] Theotis Haze ORN, NP   1 year ago Right lower quadrant pain   [place] [facility] [place] - A Dept Of Poplar. [facility] Delbert Clam, MD   2 years ago Essential hypertension   [facility] [facility] [place] California - A  Dept Of Prong[place]. [facility] [place], Zelda W, NP               losartan  (COZAAR ) 25 MG tablet 30 tablet 0    Sig: Take 1 tablet (25 mg total) by mouth daily.     Cardiovascular:  Angiotensin Receptor Blockers Failed - 10/15/2023  5:47 PM      Failed - Cr in normal range and within 180 days    Creatinine, Ser  Date Value Ref Range Status  03/24/2023 0.80 0.76 - 1.27 mg/dL Final         Failed - K in normal range and within 180 days    Potassium  Date Value Ref Range Status  03/24/2023 4.2 3.5 - 5.2 mmol/L Final         Failed - Last BP in normal range    BP Readings from Last 1 Encounters:  03/24/23 (!) 151/90         Failed - Valid encounter within last 6 months    Recent Outpatient Visits           6 months ago Primary hypertension   [facility] - A Dept Of [place]. [facility] Theotis Haze ORN, NP   1 year ago Primary hypertension   [facility] [facility] Pinnacle - A Dept Of Ec[place]. [facility] Theotis Haze ORN, NP   1 year ago Essential hypertension   [place] Heights [facility] Liberty City - A Dept Of Ches[place]. [facility] Theotis Haze ORN, NP   1 year ago Right lower quadrant pain   Eagle [facility] Rut[place] - A Dept Of [place]. [facility] Delbert Clam, MD   2 years ago Essential hypertension   [place] [facility] Rutledge - A Dept Of [place]. [facility], Haze ORN, [place]              Passed - Patient is not pregnant       mometasone  (ELOCON ) 0.1 % ointment 45 g 3    Sig: Apply topically daily. APPLY TO AREAS OF PATCHY SKIN     Off-Protocol Failed - 10/15/2023  5:47 PM      Failed - Medication not assigned to a protocol, review manually.      Passed - Valid encounter within last 12 months    Recent Outpatient Visits           6 months ago Primary hypertension   [place] [place] [facility] Colp - A Dept Of [place]. [facility] Theotis Haze ORN,  NP   1 year ago Primary hypertension    Comm Health [place] Ron[place]oma - A Dept Of [place]. [facility] Theotis Haze ORN, NP   1 year ago Essential hypertension   Cone  Health Comm Health Chesapeake City - A Dept Of [place]. [facility] Theotis Haze ORN, NP   1 year ago Right lower quadrant pain   [place] [facility] [place] - A Dept Of [place]. [facility] Delbert Clam, MD   2 years ago Essential hypertension   [place] [facility] Shattuck - A Dept Of [place]. [facility] [place], Zelda W, NP               nitroGLYCERIN  (NITROSTAT ) 0.4 MG SL tablet 25 tablet 0    Sig: Place 1 tablet (0.4 mg total) under the tongue every 5 (five) minutes x 3 doses as needed for chest pain.     Cardiovascular:  Nitrates Failed - 10/15/2023  5:47 PM      Failed - Last BP in normal range    BP Readings from Last 1 Encounters:  03/24/23 (!) 151/90         Passed - Last Heart Rate in normal range    Pulse Readings from Last 1 Encounters:  03/24/23 96         Passed - Valid encounter within last 12 months    Recent Outpatient Visits           6 months ago Primary hypertension   [place] [facility] E[facility] - A Dept Of Day. [facility] Theotis Haze ORN, NP   1 year ago Primary hypertension   [place] [facility] Heidelberg - A Dept Of Thayer. [facility] Theotis Haze ORN, NP   1 year ago Essential hypertension   [facility] Redbird - A Dept Of [place]. [facility] Theotis Haze ORN, NP   1 year ago Right lower quadrant pain   [place] [facility] Gladeville - A Dept Of [place]. [facility] Delbert Clam, MD   2 years ago Essential hypertension   Edwards [facility] Stonewall - A Dept Of [place]. [facility] [place], [place], NP              Refused Prescriptions Disp Refills   aspirin  81 MG chewable tablet 90 tablet 3    Sig: Chew 1 tablet (81 mg total) by mouth  daily.     Analgesics:  NSAIDS - aspirin  Passed - 10/15/2023  5:47 PM      Passed - Cr in normal range and within 360 days    Creatinine, Ser  Date Value Ref Range Status  03/24/2023 0.80 0.76 - 1.27 mg/dL Final         Passed - eGFR is 10 or above and within 360 days    GFR calc Af Amer  Date Value Ref Range Status  07/26/2019 114 >59 mL/min/1.73 Final    Comment:    **[facility] currently reports eGFR in compliance with the current**   recommendations of the [employer]. [facility] will   update reporting as new guidelines are published from the NKF-ASN   Task force.    GFR calc non Af Amer  Date Value Ref Range Status  07/26/2019 99 >59 mL/min/1.73 Final   eGFR  Date Value Ref Range Status  03/24/2023 103 >59 mL/min/1.73 Final         Passed - Patient is not pregnant      Passed - Valid encounter within last 12 months    Recent Outpatient Visits  6 months ago Primary hypertension   [facility] [place] - A Dept Of [place]. [facility] Theotis Haze ORN, NP   1 year ago Primary hypertension   [place] [facility] Glen[facility] - A Dept Of Eros. [facility] Theotis Haze ORN, NP   1 year ago Essential hypertension   [place] [facility] Bridge[facility] - A Dept Of Avinger. [facility] Theotis Haze ORN, NP   1 year ago Right lower quadrant pain   [place] [facility] Capt[place] - A Dept Of [place]. [facility] Delbert Clam, MD   2 years ago Essential hypertension    Comm Health Salineville - A Dept Of [place]. [facility] Theotis Haze ORN, [place]

## 2023-10-15 NOTE — Telephone Encounter (Signed)
 FYI Only or Action Required?: FYI only for provider.  Patient was last seen in primary care on 03/24/2023 by Theotis Haze ORN, NP.  Called Nurse Triage reporting Diabetes.  Symptoms began several weeks ago.  Interventions attempted: Nothing.  Symptoms are: rapidly worsening.  Triage Disposition: Go to ED Now (Notify PCP)  Patient/caregiver understands and will follow disposition?: No, refuses disposition  Copied from CRM [id number]. Topic: Clinical - Red Word Triage >> Oct 15, 2023 10:21 AM Derrick Villa wrote: Red Word that prompted transfer to Nurse Triage: Patient states his blood sugar is high but hasn't checked it. States he has symptoms: increased thirst, increase in urination frequency, clear and foamy urine. Reason for Disposition  [1] New-onset diabetes suspected (e.g., frequent urination, weak, weight loss) AND [2] vomiting or rapid breathing  Answer Assessment - Initial Assessment Questions Pt calls w sxs of hyperglycemia; increased frequent foamy discolored urination, blurry vision, dizzy spells, nausea, heartburn, bloated stomach. Pt states he was dx borderline diabetic and does not check sugars. Also states the last two months he's drinking 2L soda daily and not eating healthy. RN advised ED, Pt adamantly refused stating he would not be waiting for 12 hours to be seen. RN attempted to educate on importance of evaluation and treatment for these sxs and discussed complications and worsening presentation. Pt continued to refuse ED. Pt requesting appointment and RN continued to recommend ER but scheduled appointment for Monday oct 6. CAL called for ED refusal.   1. BLOOD GLUCOSE: What is your blood glucose level?      Denies checking 2. ONSET: When did you check the blood glucose?     Last couple weeks noticed sxs 3. USUAL RANGE: What is your glucose level usually? (e.g., usual fasting morning value, usual evening value)     unsure 4. KETONES: Do you check for ketones (urine  or blood test strips)? If Yes, ask: What does the test show now?      Denies checking; admits to foamy urine 5. TYPE 1 or 2:  Do you know what type of diabetes you have?  (e.g., Type 1, Type 2, Gestational; doesn't know)      Borderline per pt 6. INSULIN: Do you take insulin? What type of insulin(s) do you use? What is the mode of delivery? (syringe, pen; injection or pump)?      denies 7. DIABETES PILLS: Do you take any pills for your diabetes? If Yes, ask: Have you missed taking any pills recently?     denies 8. OTHER SYMPTOMS: Do you have any symptoms? (e.g., fever, frequent urination, difficulty breathing, dizziness, weakness, vomiting)     Frequent foamy urination; blurry vision, admits to dizzy spells and nausea. Kidney pain per pt. Sleep disruption. Admits to heartburn, bloated stomach  Protocols used: Diabetes - High Blood Sugar-A-AH

## 2023-10-15 NOTE — Telephone Encounter (Signed)
 Noted

## 2023-10-18 ENCOUNTER — Ambulatory Visit: Payer: Self-pay | Admitting: Nurse Practitioner

## 2023-11-03 ENCOUNTER — Emergency Department (HOSPITAL_COMMUNITY): Payer: Self-pay

## 2023-11-03 ENCOUNTER — Observation Stay (HOSPITAL_COMMUNITY)
Admission: EM | Admit: 2023-11-03 | Discharge: 2023-11-04 | Disposition: A | Discharge status: Home / Self Care | Payer: Self-pay | Attending: Internal Medicine | Admitting: Internal Medicine

## 2023-11-03 ENCOUNTER — Encounter (HOSPITAL_COMMUNITY): Payer: Self-pay

## 2023-11-03 ENCOUNTER — Other Ambulatory Visit: Payer: Self-pay

## 2023-11-03 DIAGNOSIS — I2510 Atherosclerotic heart disease of native coronary artery without angina pectoris: Secondary | ICD-10-CM | POA: Insufficient documentation

## 2023-11-03 DIAGNOSIS — Z6832 Body mass index (BMI) 32.0-32.9, adult: Secondary | ICD-10-CM | POA: Insufficient documentation

## 2023-11-03 DIAGNOSIS — I255 Ischemic cardiomyopathy: Secondary | ICD-10-CM | POA: Insufficient documentation

## 2023-11-03 DIAGNOSIS — E1165 Type 2 diabetes mellitus with hyperglycemia: Secondary | ICD-10-CM | POA: Insufficient documentation

## 2023-11-03 DIAGNOSIS — I110 Hypertensive heart disease with heart failure: Secondary | ICD-10-CM | POA: Insufficient documentation

## 2023-11-03 DIAGNOSIS — Z7982 Long term (current) use of aspirin: Secondary | ICD-10-CM | POA: Insufficient documentation

## 2023-11-03 DIAGNOSIS — Z7902 Long term (current) use of antithrombotics/antiplatelets: Secondary | ICD-10-CM | POA: Insufficient documentation

## 2023-11-03 DIAGNOSIS — E785 Hyperlipidemia, unspecified: Secondary | ICD-10-CM | POA: Insufficient documentation

## 2023-11-03 DIAGNOSIS — Z794 Long term (current) use of insulin: Secondary | ICD-10-CM | POA: Insufficient documentation

## 2023-11-03 DIAGNOSIS — R739 Hyperglycemia, unspecified: Principal | ICD-10-CM

## 2023-11-03 DIAGNOSIS — Z79899 Other long term (current) drug therapy: Secondary | ICD-10-CM | POA: Insufficient documentation

## 2023-11-03 DIAGNOSIS — E1310 Other specified diabetes mellitus with ketoacidosis without coma: Secondary | ICD-10-CM

## 2023-11-03 DIAGNOSIS — Z87891 Personal history of nicotine dependence: Secondary | ICD-10-CM | POA: Insufficient documentation

## 2023-11-03 DIAGNOSIS — E66811 Obesity, class 1: Secondary | ICD-10-CM | POA: Insufficient documentation

## 2023-11-03 DIAGNOSIS — I5022 Chronic systolic (congestive) heart failure: Secondary | ICD-10-CM | POA: Insufficient documentation

## 2023-11-03 DIAGNOSIS — I10 Essential (primary) hypertension: Secondary | ICD-10-CM | POA: Diagnosis present

## 2023-11-03 DIAGNOSIS — E1110 Type 2 diabetes mellitus with ketoacidosis without coma: Principal | ICD-10-CM | POA: Insufficient documentation

## 2023-11-03 DIAGNOSIS — N179 Acute kidney failure, unspecified: Secondary | ICD-10-CM | POA: Insufficient documentation

## 2023-11-03 LAB — URINALYSIS, ROUTINE W REFLEX MICROSCOPIC
Bacteria, UA: NONE SEEN
Bilirubin Urine: NEGATIVE
Glucose, UA: >=500 mg/dL — AB
Hgb urine dipstick: NEGATIVE
Ketones, ur: 20 mg/dL — AB
Leukocytes,Ua: NEGATIVE
Nitrite: NEGATIVE
Protein, ur: NEGATIVE mg/dL
Specific Gravity, Urine: 1.025 (ref 1.005–1.030)
pH: 6 (ref 5.0–8.0)

## 2023-11-03 LAB — BASIC METABOLIC PANEL WITH GFR
Anion gap: 13 (ref 5–15)
Anion gap: 12 (ref 5–15)
Anion gap: 11 (ref 5–15)
BUN: 18 mg/dL (ref 6–20)
BUN: 15 mg/dL (ref 6–20)
BUN: 13 mg/dL (ref 6–20)
CO2: 24 mmol/L (ref 22–32)
CO2: 23 mmol/L (ref 22–32)
CO2: 24 mmol/L (ref 22–32)
Calcium: 8.8 mg/dL — ABNORMAL LOW (ref 8.9–10.3)
Calcium: 9 mg/dL (ref 8.9–10.3)
Calcium: 8.9 mg/dL (ref 8.9–10.3)
Chloride: 96 mmol/L — ABNORMAL LOW (ref 98–111)
Chloride: 95 mmol/L — ABNORMAL LOW (ref 98–111)
Chloride: 97 mmol/L — ABNORMAL LOW (ref 98–111)
Creatinine, Ser: 0.98 mg/dL (ref 0.61–1.24)
Creatinine, Ser: 0.93 mg/dL (ref 0.61–1.24)
Creatinine, Ser: 0.92 mg/dL (ref 0.61–1.24)
GFR, Estimated: >60 mL/min (ref >60)
GFR, Estimated: >60 mL/min (ref >60)
GFR, Estimated: >60 mL/min (ref >60)
Glucose, Bld: 233 mg/dL — ABNORMAL HIGH (ref 70–99)
Glucose, Bld: 217 mg/dL — ABNORMAL HIGH (ref 70–99)
Glucose, Bld: 209 mg/dL — ABNORMAL HIGH (ref 70–99)
Potassium: 3.4 mmol/L — ABNORMAL LOW (ref 3.5–5.1)
Potassium: 3.6 mmol/L (ref 3.5–5.1)
Potassium: 3.5 mmol/L (ref 3.5–5.1)
Sodium: 132 mmol/L — ABNORMAL LOW (ref 135–145)
Sodium: 130 mmol/L — ABNORMAL LOW (ref 135–145)
Sodium: 132 mmol/L — ABNORMAL LOW (ref 135–145)

## 2023-11-03 LAB — COMPREHENSIVE METABOLIC PANEL WITH GFR
ALT: 24 U/L (ref 0–44)
AST: 26 U/L (ref 15–41)
Albumin: 4.1 g/dL (ref 3.5–5.0)
Alkaline Phosphatase: 182 U/L — ABNORMAL HIGH (ref 38–126)
Anion gap: 18 — ABNORMAL HIGH (ref 5–15)
BUN: 25 mg/dL — ABNORMAL HIGH (ref 6–20)
CO2: 19 mmol/L — ABNORMAL LOW (ref 22–32)
Calcium: 9.3 mg/dL (ref 8.9–10.3)
Chloride: 86 mmol/L — ABNORMAL LOW (ref 98–111)
Creatinine, Ser: 1.32 mg/dL — ABNORMAL HIGH (ref 0.61–1.24)
GFR, Estimated: >60 mL/min (ref >60)
Glucose, Bld: 612 mg/dL (ref 70–99)
Potassium: 4.5 mmol/L (ref 3.5–5.1)
Sodium: 123 mmol/L — ABNORMAL LOW (ref 135–145)
Total Bilirubin: 0.5 mg/dL (ref 0.0–1.2)
Total Protein: 7.1 g/dL (ref 6.5–8.1)

## 2023-11-03 LAB — I-STAT CHEM 8, ED
BUN: 26 mg/dL — ABNORMAL HIGH (ref 6–20)
Calcium, Ion: 1.22 mmol/L (ref 1.15–1.40)
Chloride: 93 mmol/L — ABNORMAL LOW (ref 98–111)
Creatinine, Ser: 1.2 mg/dL (ref 0.61–1.24)
Glucose, Bld: 586 mg/dL (ref 70–99)
HCT: 42 % (ref 39.0–52.0)
Hemoglobin: 14.3 g/dL (ref 13.0–17.0)
Potassium: 4.4 mmol/L (ref 3.5–5.1)
Sodium: 126 mmol/L — ABNORMAL LOW (ref 135–145)
TCO2: 19 mmol/L — ABNORMAL LOW (ref 22–32)

## 2023-11-03 LAB — CBG MONITORING, ED
Glucose-Capillary: >600 mg/dL (ref 70–99)
Glucose-Capillary: >600 mg/dL (ref 70–99)
Glucose-Capillary: 481 mg/dL — ABNORMAL HIGH (ref 70–99)
Glucose-Capillary: 434 mg/dL — ABNORMAL HIGH (ref 70–99)
Glucose-Capillary: 317 mg/dL — ABNORMAL HIGH (ref 70–99)
Glucose-Capillary: 235 mg/dL — ABNORMAL HIGH (ref 70–99)
Glucose-Capillary: 250 mg/dL — ABNORMAL HIGH (ref 70–99)
Glucose-Capillary: 232 mg/dL — ABNORMAL HIGH (ref 70–99)
Glucose-Capillary: 200 mg/dL — ABNORMAL HIGH (ref 70–99)
Glucose-Capillary: 204 mg/dL — ABNORMAL HIGH (ref 70–99)
Glucose-Capillary: 185 mg/dL — ABNORMAL HIGH (ref 70–99)

## 2023-11-03 LAB — CBC
HCT: 39.7 % (ref 39.0–52.0)
Hemoglobin: 14.1 g/dL (ref 13.0–17.0)
MCH: 30.2 pg (ref 26.0–34.0)
MCHC: 35.5 g/dL (ref 30.0–36.0)
MCV: 85 fL (ref 80.0–100.0)
Platelets: 270 K/uL (ref 150–400)
RBC: 4.67 MIL/uL (ref 4.22–5.81)
RDW: 11.8 % (ref 11.5–15.5)
WBC: 12.2 K/uL — ABNORMAL HIGH (ref 4.0–10.5)
nRBC: 0 % (ref 0.0–0.2)

## 2023-11-03 LAB — GLUCOSE, CAPILLARY
Glucose-Capillary: 200 mg/dL — ABNORMAL HIGH (ref 70–99)
Glucose-Capillary: 202 mg/dL — ABNORMAL HIGH (ref 70–99)
Glucose-Capillary: 207 mg/dL — ABNORMAL HIGH (ref 70–99)
Glucose-Capillary: 208 mg/dL — ABNORMAL HIGH (ref 70–99)
Glucose-Capillary: 208 mg/dL — ABNORMAL HIGH (ref 70–99)
Glucose-Capillary: 212 mg/dL — ABNORMAL HIGH (ref 70–99)

## 2023-11-03 LAB — MAGNESIUM: Magnesium: 2.1 mg/dL (ref 1.7–2.4)

## 2023-11-03 LAB — BLOOD GAS, VENOUS
Acid-base deficit: 4.6 mmol/L — ABNORMAL HIGH (ref 0.0–2.0)
Bicarbonate: 22.1 mmol/L (ref 20.0–28.0)
Drawn by: 7020
O2 Saturation: 60.1 %
Patient temperature: 37
pCO2, Ven: 46 mmHg (ref 44–60)
pH, Ven: 7.29 (ref 7.25–7.43)
pO2, Ven: 36 mmHg (ref 32–45)

## 2023-11-03 LAB — BETA-HYDROXYBUTYRIC ACID
Beta-Hydroxybutyric Acid: 3.74 mmol/L — ABNORMAL HIGH (ref 0.05–0.27)
Beta-Hydroxybutyric Acid: 1.72 mmol/L — ABNORMAL HIGH (ref 0.05–0.27)
Beta-Hydroxybutyric Acid: 1.7 mmol/L — ABNORMAL HIGH (ref 0.05–0.27)
Beta-Hydroxybutyric Acid: 1.28 mmol/L — ABNORMAL HIGH (ref 0.05–0.27)

## 2023-11-03 MED ORDER — POTASSIUM CHLORIDE CRYS ER 20 MEQ PO TBCR
40.0000 meq | EXTENDED_RELEASE_TABLET | Freq: Once | ORAL | Status: AC
  Administered 2023-11-03: 40 meq via ORAL
  Filled 2023-11-03: qty 2

## 2023-11-03 MED ORDER — PHENYLEPHRINE-MINERAL OIL-PET 0.25-14-74.9 % RE OINT
1.0000 | TOPICAL_OINTMENT | Freq: Two times a day (BID) | RECTAL | Status: DC | PRN
  Administered 2023-11-03: 1 via RECTAL
  Filled 2023-11-03: qty 57

## 2023-11-03 MED ORDER — ACETAMINOPHEN 325 MG PO TABS
650.0000 mg | ORAL_TABLET | Freq: Four times a day (QID) | ORAL | Status: DC | PRN
  Administered 2023-11-03 – 2023-11-04 (×2): 650 mg via ORAL
  Filled 2023-11-03 (×2): qty 2

## 2023-11-03 MED ORDER — ONDANSETRON HCL 4 MG/2ML IJ SOLN: 4.0000 mg | Freq: Four times a day (QID) | INTRAMUSCULAR | Status: DC | PRN

## 2023-11-03 MED ORDER — CARMEX CLASSIC LIP BALM EX OINT
TOPICAL_OINTMENT | Freq: Once | CUTANEOUS | Status: AC
  Filled 2023-11-03: qty 10

## 2023-11-03 MED ORDER — ATORVASTATIN CALCIUM 40 MG PO TABS
80.0000 mg | ORAL_TABLET | Freq: Every day | ORAL | Status: DC
  Administered 2023-11-03 – 2023-11-04 (×2): 80 mg via ORAL
  Filled 2023-11-03 (×2): qty 2

## 2023-11-03 MED ORDER — CHLORHEXIDINE GLUCONATE CLOTH 2 % EX PADS: 6.0000 | MEDICATED_PAD | Freq: Every day | CUTANEOUS | Status: DC

## 2023-11-03 MED ORDER — ASPIRIN 81 MG PO CHEW
81.0000 mg | CHEWABLE_TABLET | Freq: Every day | ORAL | Status: DC
  Administered 2023-11-03 – 2023-11-04 (×2): 81 mg via ORAL
  Filled 2023-11-03 (×2): qty 1

## 2023-11-03 MED ORDER — DEXTROSE 50 % IV SOLN: 0.0000 mL | INTRAVENOUS | Status: DC | PRN

## 2023-11-03 MED ORDER — HEPARIN SODIUM (PORCINE) 5000 UNIT/ML IJ SOLN
5000.0000 [IU] | Freq: Three times a day (TID) | INTRAMUSCULAR | Status: DC
  Administered 2023-11-03 – 2023-11-04 (×5): 5000 [IU] via SUBCUTANEOUS
  Filled 2023-11-03 (×6): qty 1

## 2023-11-03 MED ORDER — SODIUM CHLORIDE 0.9 % IV SOLN: INTRAVENOUS | Status: DC

## 2023-11-03 MED ORDER — LACTATED RINGERS IV BOLUS
2000.0000 mL | Freq: Once | INTRAVENOUS | Status: AC
  Administered 2023-11-03: 2000 mL via INTRAVENOUS

## 2023-11-03 MED ORDER — INSULIN REGULAR(HUMAN) IN NACL 100-0.9 UT/100ML-% IV SOLN
INTRAVENOUS | Status: DC
  Administered 2023-11-03: 10.5 [IU]/h via INTRAVENOUS
  Administered 2023-11-04: 2.6 [IU]/h via INTRAVENOUS
  Filled 2023-11-03 (×2): qty 100

## 2023-11-03 MED ORDER — INSULIN STARTER KIT- PEN NEEDLES (ENGLISH)
1.0000 | Freq: Once | Status: AC
  Administered 2023-11-04: 1
  Filled 2023-11-03: qty 1

## 2023-11-03 MED ORDER — MELATONIN 3 MG PO TABS
3.0000 mg | ORAL_TABLET | Freq: Every evening | ORAL | Status: DC | PRN
  Administered 2023-11-03: 3 mg via ORAL
  Filled 2023-11-03: qty 1

## 2023-11-03 MED ORDER — ALBUTEROL SULFATE (2.5 MG/3ML) 0.083% IN NEBU: 2.5000 mg | INHALATION_SOLUTION | RESPIRATORY_TRACT | Status: DC | PRN

## 2023-11-03 MED ORDER — CARVEDILOL 3.125 MG PO TABS
3.1250 mg | ORAL_TABLET | Freq: Two times a day (BID) | ORAL | Status: DC
  Administered 2023-11-03 – 2023-11-04 (×2): 3.125 mg via ORAL
  Filled 2023-11-03 (×2): qty 1

## 2023-11-03 MED ORDER — ACETAMINOPHEN 650 MG RE SUPP: 650.0000 mg | Freq: Four times a day (QID) | RECTAL | Status: DC | PRN

## 2023-11-03 MED ORDER — LIVING WELL WITH DIABETES BOOK
Freq: Once | Status: AC
  Administered 2023-11-04: 1
  Filled 2023-11-03: qty 1

## 2023-11-03 MED ORDER — LACTATED RINGERS IV SOLN: INTRAVENOUS | Status: AC

## 2023-11-03 MED ORDER — CLOPIDOGREL BISULFATE 75 MG PO TABS
75.0000 mg | ORAL_TABLET | Freq: Every day | ORAL | Status: DC
  Administered 2023-11-03 – 2023-11-04 (×2): 75 mg via ORAL
  Filled 2023-11-03 (×2): qty 1

## 2023-11-03 MED ORDER — DEXTROSE IN LACTATED RINGERS 5 % IV SOLN: INTRAVENOUS | Status: AC

## 2023-11-03 NOTE — Inpatient Diabetes Management (Signed)
 Inpatient Diabetes Program Recommendations  AACE/ADA: New Consensus Statement on Inpatient Glycemic Control (2015)  Target Ranges:  Prepandial:   less than 140 mg/dL      Peak postprandial:   less than 180 mg/dL (1-2 hours)      Critically ill patients:  140 - 180 mg/dL   Lab Results  Component Value Date   GLUCAP 481 (H) 11/03/2023   HGBA1C 6.1 (H) 03/24/2023    Review of Glycemic Control  Diabetes history: Prediabetes Outpatient Diabetes medications: None, previously on metformin  Current orders for Inpatient glycemic control: IV insulin per EndoTool for DKA  HgbA1C - 6.1% on 03/13/2023 Need updated HgbA1C  Inpatient Diabetes Program Recommendations:    Continue with IV insulin, as criteria has not been met to discontinue insulin drip. BHB 1.28.  Consider Lantus 20 units Q24H  Novolog 0-15 Q4H x 12H, then TID with meals and 0-5 HS  Novolog 4 units TID with meals if eating > 50%  CHO mod diet  Briefly spoke with pt in ED late afternoon regarding his new diabetes diagnosis and pending HgbA1C results. Pt states he ignored his prediabetes and quit taking metformin  d/t GI issues. Discussed lifestyle modification with diet, exercise, weight loss and taking DM meds as prescribed.  Ordered Living Well with Diabetes book and Insulin pen starter kit.  See pt on 10/23 am to teach insulin pen administration and discuss diabetes survival skills. Pt will need affordable insulin and PCP at discharge to manage his diabetes.  Continue to follow.  Thank you. Shona Brandy, RD, LDN, CDCES Inpatient Diabetes Coordinator [phone]

## 2023-11-03 NOTE — ED Triage Notes (Signed)
 Pt presents via EMS c/o dizziness, N/D, polyuria, and polydipsia x2 weeks. EMS reports pt has been previously placed on Metformin  however does not take due to intolerance. EMS reports CBG 266. Pt reports RLQ abd pain. A&O x4.

## 2023-11-03 NOTE — ED Notes (Signed)
 Patient transported to CT
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
## 2023-11-03 NOTE — Progress Notes (Addendum)
  Carryover admission to the Day Admitter.  I discussed this case with the EDP, Dr. Palumbo.  Per these discussions:   This is a 58 year old male with history of poorly controlled type 2 diabetes mellitus, medication noncompliance, chronic systolic heart failure, with most recent LVEF 40%, chronic abdominal pain, , who is being admitted with DKA after presenting with progressive polyuria polydipsia in the context of independently having discontinued all of his outpatient diabetic medications, which may have been limited to just metformin .  Patient would benefit from additional diabetic education.  Presenting blood sugar greater than 600, with anion gap of 18.  Beta-hydroxybutyrate acid level is currently pending, while urinalysis showed the presence of ketones.  Started on insulin drip via Endo tool.  I have placed an order for observation for further evaluation management of presenting DKA.  I have placed some additional preliminary admit orders via the adult multi-morbid admission order set. I have also continued existing orders for insulin drip via Endo tool, as well as existing orders for every 4 hour BMPs as well as every 4 hour beta-hydroxybutyrate acid levels.  Have added every hour CBG monitoring, n.p.o., and have added on a magnesium level.  I have continued existing IV fluid orders per DKA protocol.   Eva Pore, DO Hospitalist

## 2023-11-03 NOTE — ED Provider Notes (Signed)
  EMERGENCY DEPARTMENT AT [facility] Provider Note   CSN: [id number] Arrival date & time: 11/03/23  0446     Patient presents with: Hyperglycemia   Derrick Villa is a 58 y.o. male.   The history is provided by the patient.  Hyperglycemia Blood sugar level PTA:  200s Severity:  Moderate Onset quality:  Gradual Timing:  Constant Progression:  Unchanged Current diabetic treatments: was supposed to be on Metformin  but is not taking due to side effects and incomplete understanding of it's necessity. Current diabetic therapy:  None Time since last antidiabetic medication: years. Relieved by:  Nothing Ineffective treatments:  None tried Associated symptoms: increased thirst, nausea and polyuria   Associated symptoms: no fever   Risk factors: no family hx of diabetes, no hx of DKA and no recent steroid use   Patient with DM and CAD and h/o RLQ pain presents with nausea and diarrhea, polyuria and polydipsia.  Is supposed to be on metformin  but did not understand it's importance and stopped it due to side effects.  No f/c/r.      Prior to Admission medications   Medication Sig Start Date End Date Taking? Authorizing Provider  aspirin  81 MG chewable tablet Chew 1 tablet (81 mg total) by mouth daily. 03/24/23   Fleming, Zelda W, NP  atorvastatin  (LIPITOR ) 80 MG tablet Take 1 tablet (80 mg total) by mouth daily. 10/15/23   Fleming, Zelda W, NP  carvedilol  (COREG ) 3.125 MG tablet Take 1 tablet (3.125 mg total) by mouth 2 (two) times daily. 10/15/23   Fleming, Zelda W, NP  clopidogrel  (PLAVIX ) 75 MG tablet Take 1 tablet (75 mg total) by mouth daily. 10/15/23   Fleming, Zelda W, NP  cyclobenzaprine  (FLEXERIL ) 10 MG tablet Take 1 tablet (10 mg total) by mouth 3 (three) times daily as needed for muscle spasms. 03/24/23   Fleming, Zelda W, NP  losartan  (COZAAR ) 25 MG tablet Take 1 tablet (25 mg total) by mouth daily. 10/15/23   Fleming, Zelda W, NP  mometasone  (ELOCON ) 0.1 %  ointment Apply topically daily. APPLY TO AREAS OF PATCHY SKIN 03/24/23   Fleming, Zelda W, NP  nitroGLYCERIN  (NITROSTAT ) 0.4 MG SL tablet Place 1 tablet (0.4 mg total) under the tongue every 5 (five) minutes x 3 doses as needed for chest pain. 10/15/23   Fleming, Zelda W, NP    Allergies: Patient has no known allergies.    Review of Systems  Constitutional:  Positive for appetite change. Negative for fever.  HENT:  Negative for facial swelling.   Gastrointestinal:  Positive for nausea.  Endocrine: Positive for polydipsia and polyuria.  Genitourinary:  Positive for frequency.  All other systems reviewed and are negative.   Updated Vital Signs BP (!) 169/102 (BP Location: Right Arm)   Pulse 98   Temp 98 F (36.7 C) (Oral)   Resp 18   SpO2 100%   Physical Exam Vitals and nursing note reviewed.  Constitutional:      General: He is not in acute distress.    Appearance: Normal appearance. He is well-developed. He is not diaphoretic.  HENT:     Head: Normocephalic and atraumatic.     Nose: Nose normal.  Eyes:     Conjunctiva/sclera: Conjunctivae normal.     Pupils: Pupils are equal, round, and reactive to light.  Cardiovascular:     Rate and Rhythm: Normal rate and regular rhythm.     Pulses: Normal pulses.     Heart sounds:  Normal heart sounds.  Pulmonary:     Effort: Pulmonary effort is normal.     Breath sounds: Normal breath sounds. No wheezing or rales.  Abdominal:     General: Bowel sounds are normal.     Palpations: Abdomen is soft.     Tenderness: There is no abdominal tenderness. There is no guarding or rebound. Negative signs include Murphy's sign, Rovsing's sign and McBurney's sign.     Hernia: No hernia is present.  Musculoskeletal:        General: Normal range of motion.     Cervical back: Normal range of motion and neck supple.  Skin:    General: Skin is warm and dry.     Capillary Refill: Capillary refill takes less than 2 seconds.  Neurological:      General: No focal deficit present.     Mental Status: He is alert and oriented to person, place, and time.     Deep Tendon Reflexes: Reflexes normal.  Psychiatric:        Mood and Affect: Mood normal.     (all labs ordered are listed, but only abnormal results are displayed) Results for orders placed or performed during the hospital encounter of 11/03/23  CBG monitoring, ED   Collection Time: 11/03/23  4:53 AM  Result Value Ref Range   Glucose-Capillary >600 (HH) 70 - 99 mg/dL  Urinalysis, Routine w reflex microscopic -Urine, Clean Catch   Collection Time: 11/03/23  4:55 AM  Result Value Ref Range   Color, Urine STRAW (A) YELLOW   APPearance CLEAR CLEAR   Specific Gravity, Urine 1.025 1.005 - 1.030   pH 6.0 5.0 - 8.0   Glucose, UA >=500 (A) NEGATIVE mg/dL   Hgb urine dipstick NEGATIVE NEGATIVE   Bilirubin Urine NEGATIVE NEGATIVE   Ketones, ur 20 (A) NEGATIVE mg/dL   Protein, ur NEGATIVE NEGATIVE mg/dL   Nitrite NEGATIVE NEGATIVE   Leukocytes,Ua NEGATIVE NEGATIVE   RBC / HPF 0-5 0 - 5 RBC/hpf   WBC, UA 0-5 0 - 5 WBC/hpf   Bacteria, UA NONE SEEN NONE SEEN   Squamous Epithelial / HPF 0-5 0 - 5 /HPF  CBC   Collection Time: 11/03/23  5:09 AM  Result Value Ref Range   WBC 12.2 (H) 4.0 - 10.5 K/uL   RBC 4.67 4.22 - 5.81 MIL/uL   Hemoglobin 14.1 13.0 - 17.0 g/dL   HCT 60.2 60.9 - 47.9 %   MCV 85.0 80.0 - 100.0 fL   MCH 30.2 26.0 - 34.0 pg   MCHC 35.5 30.0 - 36.0 g/dL   RDW 88.1 88.4 - 84.4 %   Platelets 270 150 - 400 K/uL   nRBC 0.0 0.0 - 0.2 %  CBG monitoring, ED   Collection Time: 11/03/23  6:07 AM  Result Value Ref Range   Glucose-Capillary >600 (HH) 70 - 99 mg/dL  I-stat chem 8, ED (not at [facility], [facility] or [facility])   Collection Time: 11/03/23  6:10 AM  Result Value Ref Range   Sodium 126 (L) 135 - 145 mmol/L   Potassium 4.4 3.5 - 5.1 mmol/L   Chloride 93 (L) 98 - 111 mmol/L   BUN 26 (H) 6 - 20 mg/dL   Creatinine, Ser 8.79 0.61 - 1.24 mg/dL   Glucose, Bld 413 (HH) 70 - 99  mg/dL   Calcium , Ion 1.22 1.15 - 1.40 mmol/L   TCO2 19 (L) 22 - 32 mmol/L   Hemoglobin 14.3 13.0 - 17.0 g/dL   HCT  42.0 39.0 - 52.0 %   Comment NOTIFIED PHYSICIAN    No results found.  EKG: None  Radiology: No results found.   .Critical Care  Performed by: Nettie Earing, MD Authorized by: Nettie Earing, MD   Critical care provider statement:    Critical care time (minutes):  30   Critical care end time:  11/03/2023 6:04 AM   Critical care was necessary to treat or prevent imminent or life-threatening deterioration of the following conditions:  Endocrine crisis   Critical care was time spent personally by me on the following activities:  Development of treatment plan with patient or surrogate, discussions with consultants, evaluation of patient's response to treatment, examination of patient, ordering and review of laboratory studies, ordering and review of radiographic studies, ordering and performing treatments and interventions, pulse oximetry, re-evaluation of patient's condition and review of old charts   I assumed direction of critical care for this patient from another provider in my specialty: no     Care discussed with: admitting provider      Medications Ordered in the ED  lactated ringers  bolus 2,000 mL (has no administration in time range)  insulin regular, human (MYXREDLIN) 100 units/ 100 mL infusion (has no administration in time range)  lactated ringers  infusion (has no administration in time range)  dextrose  5 % in lactated ringers  infusion (has no administration in time range)  dextrose  50 % solution 0-50 mL (has no administration in time range)                                    Medical Decision Making Patient with polyuria and polydipsia. Was on metformin  for 1 week several years ago   Amount and/or Complexity of Data Reviewed Independent Historian: EMS    Details: See above  External Data Reviewed: notes.    Details: Has been seen by PMD for RLQ  pain on multiple occasions  Labs: ordered.    Details: Glucose > 600.  Sodium 126 (corrects to normal based on markedly elevated glucose os 612.  Normal potassium,  elevated creatinine 1.32 anion gap of 18 elevated.  Urine with glucose and ketones. White count slight elevation 12.2, normal hemoglobin 14.1, normal platelets  Radiology: ordered.  Risk Prescription drug management. Decision regarding hospitalization. Risk Details: I have informed the patient he is and has been a diabetic. He verbalizes understanding of this information.  Give markedly depressed EF 6 years ago and history of heart failure will avoid IV boluses.  Insuline initiated.  Will need education regarding diabetic diet.  Patient is non tender on exam and has a h/o of same pain.       Final diagnoses:  Hyperglycemia   The patient appears reasonably stabilized for admission considering the current resources, flow, and capabilities available in the ED at this time, and I doubt any other [facility] requiring further screening and/or treatment in the ED prior to admission.  ED Discharge Orders     None          Marisela Line, MD 11/03/23 [phone]

## 2023-11-04 ENCOUNTER — Other Ambulatory Visit (HOSPITAL_COMMUNITY): Payer: Self-pay

## 2023-11-04 ENCOUNTER — Other Ambulatory Visit (HOSPITAL_BASED_OUTPATIENT_CLINIC_OR_DEPARTMENT_OTHER): Payer: Self-pay

## 2023-11-04 DIAGNOSIS — N179 Acute kidney failure, unspecified: Secondary | ICD-10-CM | POA: Diagnosis present

## 2023-11-04 LAB — BASIC METABOLIC PANEL WITH GFR
Anion gap: 11 (ref 5–15)
BUN: 10 mg/dL (ref 6–20)
CO2: 23 mmol/L (ref 22–32)
Calcium: 8.7 mg/dL — ABNORMAL LOW (ref 8.9–10.3)
Chloride: 98 mmol/L (ref 98–111)
Creatinine, Ser: 0.83 mg/dL (ref 0.61–1.24)
GFR, Estimated: >60 mL/min (ref >60)
Glucose, Bld: 217 mg/dL — ABNORMAL HIGH (ref 70–99)
Potassium: 3.3 mmol/L — ABNORMAL LOW (ref 3.5–5.1)
Sodium: 132 mmol/L — ABNORMAL LOW (ref 135–145)

## 2023-11-04 LAB — GLUCOSE, CAPILLARY
Glucose-Capillary: 192 mg/dL — ABNORMAL HIGH (ref 70–99)
Glucose-Capillary: 198 mg/dL — ABNORMAL HIGH (ref 70–99)
Glucose-Capillary: 200 mg/dL — ABNORMAL HIGH (ref 70–99)
Glucose-Capillary: 200 mg/dL — ABNORMAL HIGH (ref 70–99)
Glucose-Capillary: 208 mg/dL — ABNORMAL HIGH (ref 70–99)
Glucose-Capillary: 208 mg/dL — ABNORMAL HIGH (ref 70–99)
Glucose-Capillary: 211 mg/dL — ABNORMAL HIGH (ref 70–99)
Glucose-Capillary: 217 mg/dL — ABNORMAL HIGH (ref 70–99)
Glucose-Capillary: 221 mg/dL — ABNORMAL HIGH (ref 70–99)
Glucose-Capillary: 226 mg/dL — ABNORMAL HIGH (ref 70–99)
Glucose-Capillary: 334 mg/dL — ABNORMAL HIGH (ref 70–99)

## 2023-11-04 LAB — CBC
HCT: 34.5 % — ABNORMAL LOW (ref 39.0–52.0)
Hemoglobin: 12.1 g/dL — ABNORMAL LOW (ref 13.0–17.0)
MCH: 29.8 pg (ref 26.0–34.0)
MCHC: 35.1 g/dL (ref 30.0–36.0)
MCV: 85 fL (ref 80.0–100.0)
Platelets: 182 K/uL (ref 150–400)
RBC: 4.06 MIL/uL — ABNORMAL LOW (ref 4.22–5.81)
RDW: 11.9 % (ref 11.5–15.5)
WBC: 8.1 K/uL (ref 4.0–10.5)
nRBC: 0 % (ref 0.0–0.2)

## 2023-11-04 LAB — HEMOGLOBIN A1C
Hgb A1c MFr Bld: >15.5 % — ABNORMAL HIGH (ref 4.8–5.6)
Mean Plasma Glucose: >398 mg/dL

## 2023-11-04 MED ORDER — INSULIN GLARGINE-YFGN 100 UNIT/ML ~~LOC~~ SOLN
20.0000 [IU] | Freq: Every day | SUBCUTANEOUS | Status: DC
  Filled 2023-11-04: qty 0.2

## 2023-11-04 MED ORDER — ORAL CARE MOUTH RINSE: 15.0000 mL | OROMUCOSAL | Status: DC | PRN

## 2023-11-04 MED ORDER — LANCET DEVICE MISC
1.0000 | 0 refills | Status: AC
  Filled 2023-11-04: qty 1, 30d supply, fill #0

## 2023-11-04 MED ORDER — INSULIN ASPART PROT & ASPART (70-30 MIX) 100 UNIT/ML PEN
20.0000 [IU] | PEN_INJECTOR | Freq: Two times a day (BID) | SUBCUTANEOUS | 11 refills | Status: DC
  Filled 2023-11-04: qty 12, 30d supply, fill #0

## 2023-11-04 MED ORDER — BLOOD GLUCOSE MONITOR SYSTEM W/DEVICE KIT
1.0000 | PACK | 0 refills | Status: DC
  Filled 2023-11-04: qty 1, 30d supply, fill #0

## 2023-11-04 MED ORDER — INSULIN ASPART 100 UNIT/ML IJ SOLN: 0.0000 [IU] | Freq: Every day | INTRAMUSCULAR | Status: DC

## 2023-11-04 MED ORDER — PEN NEEDLES 31G X 5 MM MISC
1.0000 | 0 refills | Status: DC
  Filled 2023-11-04: qty 100, 25d supply, fill #0

## 2023-11-04 MED ORDER — NOVOLIN 70/30 FLEXPEN RELION (70-30) 100 UNIT/ML ~~LOC~~ SUPN
20.0000 [IU] | PEN_INJECTOR | Freq: Two times a day (BID) | SUBCUTANEOUS | 0 refills | Status: DC
  Filled 2023-11-04: qty 15, 38d supply, fill #0

## 2023-11-04 MED ORDER — INSULIN ASPART 100 UNIT/ML FLEXPEN
0.0000 [IU] | PEN_INJECTOR | Freq: Three times a day (TID) | SUBCUTANEOUS | 0 refills | Status: DC
  Filled 2023-11-04: qty 15, 27d supply, fill #0

## 2023-11-04 MED ORDER — INSULIN GLARGINE-YFGN 100 UNIT/ML ~~LOC~~ SOLN
20.0000 [IU] | Freq: Every day | SUBCUTANEOUS | Status: DC
  Administered 2023-11-04: 20 [IU] via SUBCUTANEOUS
  Filled 2023-11-04: qty 0.2

## 2023-11-04 MED ORDER — DEXTROSE IN LACTATED RINGERS 5 % IV SOLN: INTRAVENOUS | Status: DC

## 2023-11-04 MED ORDER — INSULIN ASPART 100 UNIT/ML IJ SOLN
0.0000 [IU] | Freq: Three times a day (TID) | INTRAMUSCULAR | Status: DC
  Administered 2023-11-04: 11 [IU] via SUBCUTANEOUS
  Administered 2023-11-04: 5 [IU] via SUBCUTANEOUS

## 2023-11-04 MED ORDER — BLOOD GLUCOSE TEST VI STRP
1.0000 | ORAL_STRIP | 0 refills | Status: DC
  Filled 2023-11-04: qty 100, 25d supply, fill #0

## 2023-11-04 MED ORDER — LANCETS MISC
1.0000 | 0 refills | Status: DC
  Filled 2023-11-04: qty 100, 25d supply, fill #0

## 2023-11-04 NOTE — Progress Notes (Addendum)
   11/04/23 1406  TOC Brief Assessment  Insurance and Status Lapsed  Patient has primary care physician Yes  Home environment has been reviewed Single family home  Prior level of function: Independent with ADL's  Prior/Current Home Services No current home services  Social Drivers of Health Review SDOH reviewed no interventions necessary  Readmission risk has been reviewed Yes  Transition of care needs no transition of care needs at this time   Pt needing medication assistance. ICM able to assist with [facility] voucher. There are no further ICM needs at this time.

## 2023-11-04 NOTE — Progress Notes (Signed)
   11/04/23 1456  AVS Discharge Documentation  AVS Discharge Instructions Including Medications Provided to patient/caregiver  Name of Person Receiving AVS Discharge Instructions Including Medications Derrick Villa  Name of Clinician That Reviewed AVS Discharge Instructions Including Medications Andriette House RN   Went over discharge instructions & medications. This RN answered patient's questions. IVs removed.

## 2023-11-04 NOTE — Discharge Summary (Signed)
 Physician Discharge Summary   Patient: Derrick Villa MRN: [id number] DOB: 01-Nov-1965  Admit date:     11/03/2023  Discharge date: 11/04/23  Discharge Physician: Delon Herald   PCP: Theotis Haze ORN, NP   Recommendations at discharge:   Check blood sugar 3 times daily with meals and at bedtime Use 70/30 insulin pen 20 units twice daily Cover blood sugars with Novolog pen on a sliding scale based on blood sugar results as per instructions [facility] Follow up with NP Theotis in 1-2 weeks  Discharge Diagnoses: Principal Problem:   DKA (diabetic ketoacidosis) (HCC) Active Problems:   Ischemic cardiomyopathy   Essential hypertension   Hyperlipidemia LDL goal <70   Coronary artery disease involving native coronary artery of native heart without angina pectoris   Class 1 obesity due to excess calories with serious comorbidity and body mass index (BMI) of 32.0 to 32.9 in adult   AKI (acute kidney injury)    Hospital Course: 58yo with h/o HTN, HLD, CAD, chronic HFrEF, and prediabetes who was not compliant with metformin  who presented on 10/22 with n/v and was found to be in DKA.  He was started on Endotool with resolution of DKA.  Diabetes coordinator has consulted.  Assessment and Plan:  Diabetic ketoacidosis Patient presented with symptomatic hyperglycemia with associated pseudohyponatremia, anion gap acidosis Likely due to unrecognized diabetes/noncompliance with metformin  Observed overnight in SDU IV insulin drip with fluids per Endotool protocol Anion gap closed with resolution of acidosis and he was transitioned to basal-bolus insulin without difficulty Hemoglobin A1c >15.5 Diabetic educator consulted Will arrange for outpatient diabetes and nutrition education   Acute kidney injury, resolved Likely ATN from dehydration related to DKA   Baseline creatinine roughly 0.80, presented with creatinine 1.32 Resolved back to  baseline   Heart failure with reduced EF Echo in 2019 with EF 40-45% and grade 1 DD Compensated at this time Continue carvedilol , losartan    Essential hypertension Continue losartan , carvedilol    CAD Continue ASA, Plavix , Lipitor   Class 1 obesity Body mass index is 30.89 kg/m.[name]  Weight loss should be encouraged Outpatient PCP/bariatric medicine f/u encouraged Significantly low or high BMI is associated with higher medical risk including morbidity and mortality      Consultants: Diabetes coordinator  Procedures: None  Antibiotics: None      Pain control - [place]  Controlled Substance Reporting System database was reviewed. and patient was instructed, not to drive, operate heavy machinery, perform activities at heights, swimming or participation in water  activities or provide baby-sitting services while on Pain, Sleep and Anxiety Medications; until their outpatient Physician has advised to do so again. Also recommended to not to take more than prescribed Pain, Sleep and Anxiety Medications.    Disposition: Home Diet recommendation:  Carb modified diet DISCHARGE MEDICATION: Allergies as of 11/04/2023   No Known Allergies      Medication List     TAKE these medications    aspirin  81 MG chewable tablet Chew 1 tablet (81 mg total) by mouth daily.   atorvastatin  80 MG tablet Commonly known as: LIPITOR  Take 1 tablet (80 mg total) by mouth daily.   Blood Glucose Monitoring Suppl Devi 1 each by Does not apply route as directed. Dispense based on patient and insurance preference. Use up to four times daily as directed. (FOR ICD-10 E10.9, E11.9).   BLOOD GLUCOSE TEST STRIPS Strp Use as directed up to four times daily as directed.  carvedilol  3.125 MG tablet Commonly known as: COREG  Take 1 tablet (3.125 mg total) by mouth 2 (two) times daily.   clopidogrel  75 MG tablet Commonly known as: PLAVIX  Take 1 tablet (75 mg total) by mouth daily.    cyclobenzaprine  10 MG tablet Commonly known as: FLEXERIL  Take 1 tablet (10 mg total) by mouth 3 (three) times daily as needed for muscle spasms.   insulin aspart 100 UNIT/ML FlexPen Commonly known as: NOVOLOG Inject 0-11 Units into the skin 3 (three) times daily with meals. Check Blood Glucose (BG) and inject per scale: BG <150= 0 unit; BG 150-200= 1 unit; BG 201-250= 3 unit; BG 251-300= 5 unit; BG 301-350= 7 unit; BG 351-400= 9 unit; BG >400= 11 unit and Call Primary Care.   insulin aspart protamine - aspart (70-30) 100 UNIT/ML FlexPen Commonly known as: NOVOLOG 70/30 MIX Inject 20 Units into the skin 2 (two) times daily with a meal.   Lancet Device Misc 1 each by Does not apply route as directed. Dispense based on patient and insurance preference. Use up to four times daily as directed. (FOR ICD-10 E10.9, E11.9).   Lancets Misc 1 each by Does not apply route as directed. Dispense based on patient and insurance preference. Use up to four times daily as directed. (FOR ICD-10 E10.9, E11.9).   losartan  25 MG tablet Commonly known as: COZAAR  Take 1 tablet (25 mg total) by mouth daily.   nitroGLYCERIN  0.4 MG SL tablet Commonly known as: NITROSTAT  Place 1 tablet (0.4 mg total) under the tongue every 5 (five) minutes x 3 doses as needed for chest pain.   NovoLIN 70/30 Kwikpen (70-30) 100 UNIT/ML KwikPen Generic drug: insulin isophane & regular human KwikPen Inject 20 Units into the skin 2 (two) times daily with a meal.   Pen Needles 31G X 5 MM Misc 1 each by Does not apply route as directed. Dispense based on patient and insurance preference. Use up to four times daily as directed. (FOR ICD-10 E10.9, E11.9).        Follow-up Information     Theotis Haze ORN, NP. Schedule an appointment as soon as possible for a visit in 1 week(s).   Specialty: Nurse Practitioner Contact information: [address] [place] [address] [place] [address] [phone]         [facility] Schedule an appointment as soon as possible for a visit.   Contact information: [address] [place] [address] [phone]         Janit Thresa HERO, DPM. Schedule an appointment as soon as possible for a visit.   Specialty: Podiatry Contact information: [address] [place] [address] [place] [address] [phone]                Discharge Exam: Fredricka Weights   11/04/23 0500  Weight: 103.3 kg     Subjective: Feeling much better.  He is blaming himself for this episode since he stopped taking metformin  after about 10 days and never followed up about it in the last couple of years.  Wants to go home today.   Objective: Vitals:   11/04/23 1000 11/04/23 1155  BP: 113/78   Pulse: 88   Resp: 15   Temp:  98.7 F (37.1 C)  SpO2: 98%     Intake/Output Summary (Last 24 hours) at 11/04/2023 1359 Last data filed at 11/04/2023 1200 Gross per 24 hour  Intake 2695.73 ml  Output 1775 ml  Net 920.73  ml   Filed Weights   11/04/23 0500  Weight: 103.3 kg    Exam:  General:  Appears calm and comfortable and is in NAD Eyes:  normal lids, iris ENT:  grossly normal hearing, lips & tongue, mmm Cardiovascular:  RRR, no m/r/g. No LE edema.  Respiratory:   CTA bilaterally with no wheezes/rales/rhonchi.  Normal respiratory effort. Abdomen:  soft, NT, ND Skin:  no rash or induration seen on limited exam Musculoskeletal:  grossly normal tone BUE/BLE, good ROM, no bony abnormality Psychiatric:  grossly normal mood and affect, speech fluent and appropriate, AOx3 Neurologic:  CN 2-12 grossly intact, moves all extremities in coordinated fashion  Data Reviewed: I have reviewed the patient's lab results since admission.  Pertinent labs for today include:   Na++ 132 K+ 3.3 Glucose 217 WBC 8.1 Hgb 12.1    Condition at discharge: improving  The results of significant diagnostics from this hospitalization (including imaging, microbiology, ancillary  and laboratory) are listed below for reference.   Imaging Studies: CT Renal Stone Study Result Date: 11/03/2023 EXAM: CT UROGRAM 11/03/2023 07:21:54 AM TECHNIQUE: CT of the abdomen and pelvis was performed without the administration of intravenous contrast. Multiplanar reformatted images as well as MIP urogram images are provided for review. Automated exposure control, iterative reconstruction, and/or weight based adjustment of the mA/kV was utilized to reduce the radiation dose to as low as reasonably achievable. COMPARISON: 01/27/2022 CLINICAL HISTORY: Abdominal/flank pain, stone suspected. FINDINGS: LOWER CHEST: No acute abnormality. LIVER: Mild hepatic steatosis. Too small to characterize low-density structure is identified within the anterior aspect of segment 4a measuring 8 mm, image 22/2. GALLBLADDER AND BILE DUCTS: Gallbladder is unremarkable. No biliary ductal dilatation. SPLEEN: No acute abnormality. PANCREAS: Pancreas is normal in size and contour without focal lesion or ductal dilatation. ADRENAL GLANDS: Normal size and morphology bilaterally. No nodule, thickening, or hemorrhage. No periadrenal stranding. KIDNEYS, URETERS AND BLADDER: Bilateral perinephric soft tissue stranding. This is nonspecific and appears unchanged from the previous exam. No nephrolithiasis or obstruction uropathy. Urinary bladder is unremarkable. GI AND BOWEL: Stomach demonstrates no acute abnormality. There is no bowel obstruction. The appendix is visualized and normal in caliber, without wall thickening, periappendiceal inflammation, or fluid. The sigmoid colon demonstrates diverticulosis without evidence of diverticulitis. No bowel wall thickening, pericolonic stranding, abscess, or free air is present PERITONEUM AND RETROPERITONEUM: No ascites. No free air. VASCULATURE: Aorta is normal in caliber. Aortic atherosclerotic calcifications. LYMPH NODES: No lymphadenopathy. REPRODUCTIVE ORGANS: No acute abnormality. BONES AND  SOFT TISSUES: No acute osseous abnormality. No focal soft tissue abnormality. IMPRESSION: 1. No acute findings. 2. No nephrolithiasis or obstructive uropathy. 3. Mild hepatic steatosis 4. Aortic atherosclerotic calcifications. Electronically signed by: Waddell Calk MD 11/03/2023 07:32 AM EDT RP Workstation: [id number]    Microbiology: Results for orders placed or performed in visit on 08/06/21  Fecal occult blood, imunochemical([facility])     Status: None   Collection Time: 08/07/21  7:42 AM   Specimen: Stool   ST  Result Value Ref Range Status   Fecal Occult Bld Negative Negative Final    Labs: CBC: Recent Labs  Lab 11/03/23 0509 11/03/23 0610 11/04/23 0327  WBC 12.2*  --  8.1  HGB 14.1 14.3 12.1*  HCT 39.7 42.0 34.5*  MCV 85.0  --  85.0  PLT 270  --  182   Basic Metabolic Panel: Recent Labs  Lab 11/03/23 0509 11/03/23 0610 11/03/23 0740 11/03/23 1234 11/03/23 1853 11/03/23 2131 11/04/23 0327  NA 123* 126*  --  132* 130* 132* 132*  K 4.5 4.4  --  3.4* 3.6 3.5 3.3*  CL 86* 93*  --  96* 95* 97* 98  CO2 19*  --   --  24 23 24 23   GLUCOSE 612* 586*  --  233* 217* 209* 217*  BUN 25* 26*  --  18 15 13 10   CREATININE 1.32* 1.20  --  0.98 0.93 0.92 0.83  CALCIUM  9.3  --   --  8.8* 9.0 8.9 8.7*  MG  --   --  2.1  --   --   --   --    Liver Function Tests: Recent Labs  Lab 11/03/23 0509  AST 26  ALT 24  ALKPHOS 182*  BILITOT 0.5  PROT 7.1  ALBUMIN 4.1   CBG: Recent Labs  Lab 11/04/23 0658 11/04/23 0759 11/04/23 0904 11/04/23 1002 11/04/23 1142  GLUCAP 211* 208* 200* 208* 334*    Discharge time spent: greater than 30 minutes.  Signed: Delon Herald, MD [facility] 11/04/2023

## 2023-11-04 NOTE — Hospital Course (Signed)
 58yo with h/o HTN, HLD, CAD, chronic HFrEF, and prediabetes who was not compliant with metformin  who presented on 10/22 with n/v and was found to be in DKA.  He was started on Endotool with resolution of DKA.  Diabetes coordinator has consulted.

## 2023-11-04 NOTE — Progress Notes (Signed)
 Discharge medications delivered to patinet at the bedside in a secure bag

## 2023-11-04 NOTE — TOC CM/SW Note (Signed)
  [employer]ICATION [employer]IST[employer] CARD Pharmacies please call [phone] for claim processing assistance.  Rx BIN: [id number] Rx Group: [id number] Rx PCN: [id number] Relationship Code: 1 Person Code: 01  Patient ID (MRN): [id number]    Patient Name: Derrick Villa   Patient DOB: [birth year]   Discharge Date: 11/04/2023  Expiration Date: 11/11/2023 (must be filled within 7 days of discharge)   Dear Derrick Villa. [name], You have been approved to have the prescriptions written by your discharging physician filled through our [facility] [place] [facility] (Medication Assistance Through [facility]) program. This program allows for a one-time (no refills) 34-day supply of selected medications for a low copay amount.  The copay is $3.00 per prescription. For instance, if you have one prescription, you will pay $3.00; for two prescriptions, you pay $6.00; for three prescriptions, you pay $9.00; and so on. Only certain pharmacies are participating in this program with [facility]. You will need to select one of the pharmacies from the attached lists and take your prescriptions, this letter, and your photo ID to one of the participating pharmacies.  We are excited that you are able to use the [facility] program to get your medications. These prescriptions must be filled within 7 days of Villa discharge or they will no longer be valid for the [facility] program. Should you have any problems with your prescriptions please contact your case management team member at [phone] for Derrick Villa/Derrick Villa or [phone] for Derrick Villa.  Thank you, [facility]

## 2023-11-04 NOTE — Inpatient Diabetes Management (Signed)
 Inpatient Diabetes Program Recommendations  AACE/ADA: New Consensus Statement on Inpatient Glycemic Control (2015)  Target Ranges:  Prepandial:   less than 140 mg/dL      Peak postprandial:   less than 180 mg/dL (1-2 hours)      Critically ill patients:  140 - 180 mg/dL   Lab Results  Component Value Date   GLUCAP 334 (H) 11/04/2023   HGBA1C >15.5 (H) 11/03/2023    Review of Glycemic Control  Diabetes history: DM2 Outpatient Diabetes medications: None, Current orders for Inpatient glycemic control: IV insulin transitioned to Novolog 0-15 TID with meals, Semglee 20 daily  Inpatient Diabetes Program Recommendations:     Educated patient on insulin pen use at home. Reviewed contents of insulin flexpen starter kit. Reviewed all steps of insulin pen including attachment of needle, 2-unit air shot, dialing up dose, giving injection, removing needle, disposal of sharps, storage of unused insulin, disposal of insulin etc. Patient able to provide successful return demonstration. Also reviewed troubleshooting with insulin pen. MD to give patient Rxs for insulin pens and insulin pen needles.  Discussed hypoglycemia s/s and treatment.  Pt to monitor blood sugars at least 3x/day and take logbook/meter to PCP for review.   Emphasized importance of lifestyle modification with diet, exercise, stress management, taking meds as prescribed.  Answered all questions.  Thank you. Shona Brandy, RD, LDN, CDCES Inpatient Diabetes Coordinator [phone]      Discharge Recommendations: Intermediate acting recommendations: insulin aspart protamine - aspart (NOVOLOG 70/30) FlexPen 15-20 units BID  Short acting recommendations:  Correction coverage ONLY Insulin aspart (NOVOLOG) FlexPen  Moderate Scale.   Supply/Referral recommendations: Glucometer Test strips Lancet device Lancets Pen needles - long (BMI >40) Referral to [facility]   Use Adult Diabetes Insulin Treatment  Post Discharge order set.

## 2023-11-08 ENCOUNTER — Telehealth: Payer: Self-pay | Admitting: Pharmacist

## 2023-11-08 ENCOUNTER — Other Ambulatory Visit: Payer: Self-pay | Admitting: Pharmacist

## 2023-11-08 ENCOUNTER — Other Ambulatory Visit: Payer: Self-pay

## 2023-11-08 ENCOUNTER — Encounter: Payer: Self-pay | Admitting: Nurse Practitioner

## 2023-11-08 ENCOUNTER — Ambulatory Visit: Payer: Self-pay | Attending: Nurse Practitioner | Admitting: Nurse Practitioner

## 2023-11-08 VITALS — BP 139/95 | HR 102 | Resp 20 | Ht 72.0 in | Wt 233.0 lb

## 2023-11-08 DIAGNOSIS — E119 Type 2 diabetes mellitus without complications: Secondary | ICD-10-CM

## 2023-11-08 DIAGNOSIS — I10 Essential (primary) hypertension: Secondary | ICD-10-CM

## 2023-11-08 DIAGNOSIS — Z1211 Encounter for screening for malignant neoplasm of colon: Secondary | ICD-10-CM

## 2023-11-08 DIAGNOSIS — K611 Rectal abscess: Secondary | ICD-10-CM

## 2023-11-08 DIAGNOSIS — Z7902 Long term (current) use of antithrombotics/antiplatelets: Secondary | ICD-10-CM

## 2023-11-08 DIAGNOSIS — Z794 Long term (current) use of insulin: Secondary | ICD-10-CM

## 2023-11-08 MED ORDER — NOVOLIN 70/30 FLEXPEN RELION (70-30) 100 UNIT/ML ~~LOC~~ SUPN
20.0000 [IU] | PEN_INJECTOR | Freq: Two times a day (BID) | SUBCUTANEOUS | 6 refills | Status: DC
  Filled 2023-11-08: qty 15, 38d supply, fill #0

## 2023-11-08 MED ORDER — INSULIN ASPART 100 UNIT/ML FLEXPEN
0.0000 [IU] | PEN_INJECTOR | Freq: Three times a day (TID) | SUBCUTANEOUS | 0 refills | Status: DC
  Filled 2023-11-08: qty 15, 27d supply, fill #0

## 2023-11-08 MED ORDER — SULFAMETHOXAZOLE-TRIMETHOPRIM 800-160 MG PO TABS: 1.0000 | ORAL_TABLET | Freq: Two times a day (BID) | ORAL | 0 refills | Status: AC

## 2023-11-08 MED ORDER — BASAGLAR KWIKPEN 100 UNIT/ML ~~LOC~~ SOPN
28.0000 [IU] | PEN_INJECTOR | Freq: Every day | SUBCUTANEOUS | 2 refills | Status: DC
  Filled 2023-11-08 – 2023-12-06 (×2): qty 9, 32d supply, fill #0
  Filled 2024-01-17: qty 9, 32d supply, fill #1

## 2023-11-08 MED ORDER — INSULIN LISPRO (1 UNIT DIAL) 100 UNIT/ML (KWIKPEN)
PEN_INJECTOR | SUBCUTANEOUS | 11 refills | Status: DC
  Filled 2023-11-08: qty 15, fill #0

## 2023-11-08 NOTE — Telephone Encounter (Signed)
 Copied from CRM [id number]. Topic: Appointments - Scheduling Inquiry for Clinic >> Nov 08, 2023  1:25 PM Antony RAMAN wrote:  Reason for CRM: calling to set up an appointment with a pharmacist named possibly Herlene

## 2023-11-08 NOTE — Patient Instructions (Addendum)
 For blood sugars 0-150 give 0 units of insulin, 151-200 give 2 units of insulin, 201-250 give 4 units, 251-300 give 6 units, 301-350 give 8 units, 351-400 give 10 units,> 400 give 12 units   Blood sugars 1-2 hours after you eat should be 180 or less  Blood sugars in the morning BEFORE you eat should be 140 or less

## 2023-11-08 NOTE — Telephone Encounter (Signed)
 Hey friend. Can we schedule him with me in 1 month prior to him seeing Zelda in Jan?

## 2023-11-08 NOTE — Telephone Encounter (Signed)
 Appointment scheduled for 12/1 at 2:00 pm.
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
## 2023-11-13 MED ORDER — INSULIN LISPRO (1 UNIT DIAL) 100 UNIT/ML (KWIKPEN): PEN_INJECTOR | SUBCUTANEOUS | 11 refills | Status: DC

## 2023-11-13 MED ORDER — INSULIN LISPRO (1 UNIT DIAL) 100 UNIT/ML (KWIKPEN)
PEN_INJECTOR | SUBCUTANEOUS | 11 refills | Status: DC
  Filled 2023-11-13: qty 15, 35d supply, fill #0

## 2023-11-14 ENCOUNTER — Other Ambulatory Visit (HOSPITAL_COMMUNITY): Payer: Self-pay

## 2023-11-14 ENCOUNTER — Other Ambulatory Visit: Payer: Self-pay

## 2023-11-15 ENCOUNTER — Other Ambulatory Visit: Payer: Self-pay

## 2023-11-15 ENCOUNTER — Telehealth: Payer: Self-pay | Admitting: Nurse Practitioner

## 2023-11-15 ENCOUNTER — Other Ambulatory Visit: Payer: Self-pay | Admitting: Nurse Practitioner

## 2023-11-15 DIAGNOSIS — S39011S Strain of muscle, fascia and tendon of abdomen, sequela: Secondary | ICD-10-CM

## 2023-11-15 DIAGNOSIS — I10 Essential (primary) hypertension: Secondary | ICD-10-CM

## 2023-11-15 DIAGNOSIS — I2510 Atherosclerotic heart disease of native coronary artery without angina pectoris: Secondary | ICD-10-CM

## 2023-11-15 NOTE — Telephone Encounter (Signed)
 Copied from CRM #[id number]. Topic: Clinical - Medication Question >> Nov 15, 2023  3:12 PM Olam RAMAN wrote: Reason for CRM: Pt was calling for an update fort Clopidogrel  Bisulfate (75 mg Oral Daily), Carvedilol  (3.125 mg Oral 2 times daily), Losartan  Potassium (25 mg Oral Daily), Cyclobenzaprine  HCl (10 mg Oral 3 times daily PRN, for muscle spams) atorvastatin  (LIPITOR ) 80 MG tablet 5 medications. Lipitor  was not on list but pt stated he needs all 5 and needs his heart medication [phone]

## 2023-11-16 NOTE — Telephone Encounter (Signed)
 Patient identified by name and date of birth.  Patient stated that he only wanted to ask about future refills of his diabetic supplies/medicine and where to call for a refill. When patient last spoke with a tech from [facility] he was told insulin and other medication was sent for him to pick up which confused him. None of his diabetic medicine/supplies are to be sent to [facility].  I did not see what he was referring to on my end. Medication/supplies for his diabetes has only been sent to a community pharmacy. Patient given contact information for our community pharmacy to call when refills are needed.

## 2023-11-16 NOTE — Telephone Encounter (Signed)
 Copied from CRM #[id number]. Topic: Clinical - Medication Question >> Nov 15, 2023  3:12 PM Olam RAMAN wrote:  Reason for CRM: Pt was calling for an update fort Clopidogrel  Bisulfate (75 mg Oral Daily), Carvedilol  (3.125 mg Oral 2 times daily), Losartan  Potassium (25 mg Oral Daily), Cyclobenzaprine  HCl (10 mg Oral 3 times daily PRN, for muscle spams) atorvastatin  (LIPITOR ) 80 MG tablet 5 medications. Lipitor  was not on list but pt stated he needs all 5 and needs his heart medication CB [phone]  >> Nov 16, 2023  8:37 AM Emylou G wrote:  Patient called.. in regards to this message - said it wasn't properly relayed.[name] -

## 2023-11-17 ENCOUNTER — Other Ambulatory Visit: Payer: Self-pay

## 2023-11-24 ENCOUNTER — Other Ambulatory Visit: Payer: Self-pay

## 2023-12-06 ENCOUNTER — Other Ambulatory Visit: Payer: Self-pay | Admitting: Pharmacist

## 2023-12-06 ENCOUNTER — Other Ambulatory Visit: Payer: Self-pay

## 2023-12-06 ENCOUNTER — Ambulatory Visit: Payer: Self-pay | Attending: Nurse Practitioner

## 2023-12-06 DIAGNOSIS — E781 Pure hyperglyceridemia: Secondary | ICD-10-CM

## 2023-12-06 MED ORDER — TRUE METRIX METER W/DEVICE KIT
PACK | 0 refills | Status: AC
  Filled 2023-12-06: qty 1, 30d supply, fill #0

## 2023-12-06 MED ORDER — PEN NEEDLES 31G X 5 MM MISC
1.0000 | 0 refills | Status: DC
  Filled 2023-12-06: qty 100, 25d supply, fill #0

## 2023-12-06 MED ORDER — TRUEPLUS LANCETS 28G MISC
6 refills | Status: AC
  Filled 2023-12-06: qty 100, 33d supply, fill #0

## 2023-12-06 MED ORDER — TRUE METRIX BLOOD GLUCOSE TEST VI STRP
ORAL_STRIP | 6 refills | Status: AC
  Filled 2023-12-06: qty 100, 33d supply, fill #0

## 2023-12-07 LAB — LIPID PANEL
Chol/HDL Ratio: 4.8 ratio (ref 0.0–5.0)
Cholesterol, Total: 159 mg/dL (ref 100–199)
HDL: 33 mg/dL — ABNORMAL LOW (ref >39)
LDL Chol Calc (NIH): 91 mg/dL (ref 0–99)
Triglycerides: 206 mg/dL — ABNORMAL HIGH (ref 0–149)
VLDL Cholesterol Cal: 35 mg/dL (ref 5–40)

## 2023-12-08 ENCOUNTER — Ambulatory Visit: Payer: Self-pay | Admitting: Nurse Practitioner

## 2023-12-08 LAB — MICROALBUMIN / CREATININE URINE RATIO
Creatinine, Urine: 200.8 mg/dL
Microalb/Creat Ratio: 109 mg/g{creat} — ABNORMAL HIGH (ref 0–29)
Microalbumin, Urine: 219.4 ug/mL

## 2023-12-13 ENCOUNTER — Other Ambulatory Visit: Payer: Self-pay

## 2023-12-13 ENCOUNTER — Ambulatory Visit: Payer: Self-pay | Attending: Family Medicine | Admitting: Pharmacist

## 2023-12-13 ENCOUNTER — Encounter: Payer: Self-pay | Admitting: Pharmacist

## 2023-12-13 DIAGNOSIS — Z794 Long term (current) use of insulin: Secondary | ICD-10-CM

## 2023-12-13 DIAGNOSIS — E119 Type 2 diabetes mellitus without complications: Secondary | ICD-10-CM

## 2023-12-13 MED ORDER — DAPAGLIFLOZIN PROPANEDIOL 10 MG PO TABS
10.0000 mg | ORAL_TABLET | Freq: Every day | ORAL | 6 refills | Status: AC
  Filled 2023-12-13: qty 30, 30d supply, fill #0
  Filled 2024-01-17: qty 90, 90d supply, fill #1

## 2023-12-13 NOTE — Progress Notes (Signed)
 S:     No chief complaint on file.  58 y.o. male who presents for diabetes evaluation, education, and management. Patient arrives in good spirits and presents without any assistance.   Patient was referred and last seen by Primary Care Provider, Derrick Villa, on 11/08/2023. At that visit, patient's insulin  was adjusted and Basaglar  was sent to replace 70/30 insulin . Of note, his urine microalbumin to creatinine ratio was elevated at 109.  PMH is significant for T2DM w/ hx of DKA, HTN, ischemic cardiomyopathy S/p STEMI involving LAD in 2019 (initial EF 25-35% at cath with improvement to 40-45% after stent placement), HLD.  Patient reports Diabetes was diagnosed in October of this year. He was hospitalized 10/22 - 11/04/23 with DKA, AKI related to DKA. He was discharged on 70/30 BID insulin  and sliding scale Humalog . Of note, he was preDM for several years. Took metformin  initially but stopped due to side effects. Prior to his admission in October, he experienced significant neuropathy (numbness in feet), polyuria, and polydipsia. Also endorses blurred vision leading up to last admission.   Today, he endorses adherence to Basaglar . Take 28 units daily. Has not had to use SSI much. Reported sugars are listed below. His hyperglycemia-associated symptoms have resolved with the exception of neuropathy. He is interested in retrying PO therapy.   Family/Social History:  -Fhx: CAD, HTN, TIA, DM  -Tobacco: former smoker (quit in 2024) -Alcohol: none reported  Current diabetes medications include: Basaglar  28 units daily, Humalog  sliding scale Current hypertension medications include: carvedilol  3.125 mg BID, losartan  25 mg daily  Current hyperlipidemia medications include: atorvastatin  80 mg dailty  Patient reports adherence to taking all medications as prescribed.   Insurance coverage: self pay  Patient denies hypoglycemic events.  Reported home fasting blood sugars: gives 90s - 130s. A  couple of readings ~150s  Reported 2 hour post-meal/random blood sugars: 180s. Gives a few specific readings 181 mg/dL, 814 mg/dL.   Patient denies nocturia (nighttime urination).  Patient reports neuropathy (nerve pain). Patient denies visual changes. Patient reports self foot exams.   Patient reported dietary habits: Eats 2-3 meals/day Breakfast: eggs Lunch: salads, chicken (lean protein) Drinks: has given up soda since dx. Will drink apple juice and water  mostly. Uses no-sugar additions.   Patient-reported exercise habits:  -Walks daily, endorses ~10 lb weight loss  O:   Lab Results  Component Value Date   HGBA1C >15.5 (H) 11/03/2023   There were no vitals filed for this visit.  Lipid Panel     Component Value Date/Time   CHOL 159 12/06/2023 0954   TRIG 206 (H) 12/06/2023 0954   HDL 33 (L) 12/06/2023 0954   CHOLHDL 4.8 12/06/2023 0954   CHOLHDL 5.4 07/22/2017 0316   VLDL 52 (H) 07/22/2017 0316   LDLCALC 91 12/06/2023 0954    Clinical Atherosclerotic Cardiovascular Disease (ASCVD): Yes  The ASCVD Risk score (Arnett DK, et al., 2019) failed to calculate for the following reasons:   Risk score cannot be calculated because patient has a medical history suggesting prior/existing ASCVD   Patient is participating in a Managed Medicaid Plan: No   A/P: Diabetes diagnosed this year currently uncontrolled based on A1c. However, he has responded well to insulin . Symptoms of hyperglycemia have resolved. He does not have any hypoglycemia. Reported home sugars are at goal. He is able to verbalize appropriate hypoglycemia management plan. Medication adherence appears to be okay. He experienced side effects to metformin  in the past. His sugars are much lower  and stable. With this degree of improved control, SGLT-2i therapy is now a safer option. We will start Farxiga for DM control and additional cardiorenal benefit. He can get this for free via MAP at our pharmacy. -Continued Basaglar   28 units daily.  -Start Farxiga 10 mg daily. Anticipate labs at next visit.  -Continued SSI for now. -Patient educated on purpose, proper use, and potential adverse effects of Farxiga.  -Extensively discussed pathophysiology of diabetes, recommended lifestyle interventions, dietary effects on blood sugar control.  -Counseled on s/sx of and management of hypoglycemia.  -Next A1c anticipated 01/2024.   ASCVD risk - secondary prevention in patient with diabetes. Last LDL is 9 mg/dL, not at goal of <44 mg/dL. High intensity statin indicated. Anticipate some additional LDL lowering with improved glycemic control. I recommend to reassess LDL next month. If still above goal, we may need to consider add-on therapy. -Continued atorvastatin  80 mg.   Written patient instructions provided. Patient verbalized understanding of treatment plan.  Total time in face to face counseling 30 minutes.    Follow-up:  Pharmacist in 4-6 weeks.  Derrick Villa, PharmD, Derrick Villa, CPP Clinical Pharmacist [facility] [phone]

## 2023-12-14 ENCOUNTER — Other Ambulatory Visit: Payer: Self-pay

## 2023-12-17 ENCOUNTER — Other Ambulatory Visit: Payer: Self-pay

## 2023-12-20 ENCOUNTER — Other Ambulatory Visit: Payer: Self-pay

## 2023-12-23 ENCOUNTER — Other Ambulatory Visit: Payer: Self-pay

## 2024-01-03 ENCOUNTER — Other Ambulatory Visit: Payer: Self-pay

## 2024-01-17 ENCOUNTER — Other Ambulatory Visit: Payer: Self-pay | Admitting: Pharmacist

## 2024-01-17 ENCOUNTER — Encounter: Payer: Self-pay | Admitting: Pharmacist

## 2024-01-17 ENCOUNTER — Ambulatory Visit: Payer: Self-pay | Attending: Nurse Practitioner | Admitting: Pharmacist

## 2024-01-17 ENCOUNTER — Other Ambulatory Visit: Payer: Self-pay

## 2024-01-17 DIAGNOSIS — E119 Type 2 diabetes mellitus without complications: Secondary | ICD-10-CM

## 2024-01-17 DIAGNOSIS — Z7984 Long term (current) use of oral hypoglycemic drugs: Secondary | ICD-10-CM

## 2024-01-17 DIAGNOSIS — Z794 Long term (current) use of insulin: Secondary | ICD-10-CM

## 2024-01-17 MED ORDER — INSULIN LISPRO (1 UNIT DIAL) 100 UNIT/ML (KWIKPEN)
PEN_INJECTOR | SUBCUTANEOUS | 11 refills | Status: AC
  Filled 2024-01-17: qty 9, 75d supply, fill #0
  Filled 2024-01-17: qty 15, 30d supply, fill #0

## 2024-01-17 NOTE — Progress Notes (Signed)
 "  I connected with  Derrick Villa on 01/17/2024 by a video enabled telemedicine application and verified that I am speaking with the correct person using two identifiers.   I discussed the limitations of evaluation and management by telemedicine. The patient expressed understanding and agreed to proceed.  Location of patient: home   Location of myself: clinic   Persons participating in the call: the patient and myself   S:     No chief complaint on file.  59 y.o. male who presents for diabetes evaluation, education, and management. Patient arrives in good spirits and presents without any assistance.   Patient was referred and last seen by Primary Care Provider, Haze Servant, on 11/08/2023. At that visit, patient's insulin  was adjusted and Basaglar  was sent to replace 70/30 insulin . Of note, his urine microalbumin to creatinine ratio was elevated at 109.  PMH is significant for T2DM w/ hx of DKA, HTN, ischemic cardiomyopathy S/p STEMI involving LAD in 2019 (initial EF 25-35% at cath with improvement to 40-45% after stent placement), HLD.  I saw him on 12/13/2023 and added Farxiga .   Today, he endorses adherence to Basaglar  but ran out Thursday (01/13/24). Takes 24 units daily. Reported sugars are listed below. His hyperglycemia-associated symptoms have resolved with the exception of neuropathy. He is adherent to the Farxiga  as well. Denies any side effects.   Family/Social History:  -Fhx: CAD, HTN, TIA, DM  -Tobacco: former smoker (quit in 2024) -Alcohol: none reported  Current diabetes medications include: Farxiga  10 mg daily, Basaglar  24 units daily, Humalog  sliding scale (has used twice in the last month) Current hypertension medications include: carvedilol  3.125 mg BID, losartan  25 mg daily  Current hyperlipidemia medications include: atorvastatin  80 mg daily Patient reports adherence to taking all medications as prescribed.   Insurance coverage: self pay  Patient denies  hypoglycemic events.  Reported home fasting blood sugars: gives 100s - 130s.  Reported 2 hour post-meal/random blood sugars: names readings mostly < 180 mg/dL. Only 2-3 outliers. Highest reading 238 mg/dL.  Patient denies nocturia (nighttime urination).  Patient reports neuropathy (nerve pain). Patient denies visual changes. Patient reports self foot exams.   Patient reported dietary habits: Eats 2-3 meals/day Breakfast: eggs Lunch: salads, chicken (lean protein) Drinks: has given up soda since dx. Will drink apple juice and water  mostly. Uses no-sugar additions.   Patient-reported exercise habits:  -Walks daily, endorses ~10 lb weight loss  O:  Lab Results  Component Value Date   HGBA1C >15.5 (H) 11/03/2023   There were no vitals filed for this visit.  Lipid Panel     Component Value Date/Time   CHOL 159 12/06/2023 0954   TRIG 206 (H) 12/06/2023 0954   HDL 33 (L) 12/06/2023 0954   CHOLHDL 4.8 12/06/2023 0954   CHOLHDL 5.4 07/22/2017 0316   VLDL 52 (H) 07/22/2017 0316   LDLCALC 91 12/06/2023 0954   Clinical Atherosclerotic Cardiovascular Disease (ASCVD): Yes  The ASCVD Risk score (Arnett DK, et al., 2019) failed to calculate for the following reasons:   Risk score cannot be calculated because patient has a medical history suggesting prior/existing ASCVD   * - Cholesterol units were assumed   Patient is participating in a Managed Medicaid Plan: No   A/P: Diabetes diagnosed this year currently uncontrolled based on A1c. However, he has responded well to insulin  and Farxiga . Symptoms of hyperglycemia have resolved. He does not have any hypoglycemia. Reported home sugars are at goal. He is able to verbalize appropriate  hypoglycemia management plan. Medication adherence appears to be okay.  -Continued Basaglar  24 units daily.  -Continued Farxiga  10 mg daily. Anticipate labs at next visit.  -HOLD SSI for now. -Patient educated on purpose, proper use, and potential adverse  effects of Farxiga .  -Extensively discussed pathophysiology of diabetes, recommended lifestyle interventions, dietary effects on blood sugar control.  -Counseled on s/sx of and management of hypoglycemia.  -Next A1c anticipated 01/2024.   ASCVD risk - secondary prevention in patient with diabetes. Last LDL is 91 mg/dL, not at goal of <44 mg/dL. High intensity statin indicated. Anticipate some additional LDL lowering with improved glycemic control. I recommend to reassess LDL next month. If still above goal, we may need to consider add-on therapy. -Continued atorvastatin  80 mg.   Written patient instructions provided. Patient verbalized understanding of treatment plan.  Total time in face to face counseling 30 minutes.    Follow-up:  Pharmacist in 4-6 weeks.  Herlene Fleeta Morris, PharmD, JAQUELINE, CPP Clinical Pharmacist [facility] [phone]   "

## 2024-01-26 ENCOUNTER — Other Ambulatory Visit: Payer: Self-pay

## 2024-02-11 ENCOUNTER — Other Ambulatory Visit: Payer: Self-pay

## 2024-02-11 ENCOUNTER — Encounter: Payer: Self-pay | Admitting: Nurse Practitioner

## 2024-02-11 ENCOUNTER — Ambulatory Visit: Payer: Self-pay | Attending: Nurse Practitioner | Admitting: Nurse Practitioner

## 2024-02-11 VITALS — BP 115/77 | HR 80 | Ht 72.0 in | Wt 227.6 lb

## 2024-02-11 DIAGNOSIS — I2510 Atherosclerotic heart disease of native coronary artery without angina pectoris: Secondary | ICD-10-CM

## 2024-02-11 DIAGNOSIS — Z794 Long term (current) use of insulin: Secondary | ICD-10-CM

## 2024-02-11 DIAGNOSIS — E119 Type 2 diabetes mellitus without complications: Secondary | ICD-10-CM

## 2024-02-11 DIAGNOSIS — S39011S Strain of muscle, fascia and tendon of abdomen, sequela: Secondary | ICD-10-CM

## 2024-02-11 LAB — POCT GLYCOSYLATED HEMOGLOBIN (HGB A1C): HbA1c, POC (controlled diabetic range): 6.6 % (ref 0.0–7.0)

## 2024-02-11 MED ORDER — CYCLOBENZAPRINE HCL 10 MG PO TABS
10.0000 mg | ORAL_TABLET | Freq: Three times a day (TID) | ORAL | 0 refills | Status: AC | PRN
  Filled 2024-02-11: qty 90, 30d supply, fill #0

## 2024-02-11 MED ORDER — BASAGLAR KWIKPEN 100 UNIT/ML ~~LOC~~ SOPN
24.0000 [IU] | PEN_INJECTOR | Freq: Every day | SUBCUTANEOUS | 2 refills | Status: AC
  Filled 2024-02-11: qty 9, 37d supply, fill #0

## 2024-02-11 MED ORDER — BASAGLAR KWIKPEN 100 UNIT/ML ~~LOC~~ SOPN
28.0000 [IU] | PEN_INJECTOR | Freq: Every day | SUBCUTANEOUS | 2 refills | Status: DC
  Filled 2024-02-11: qty 9, 32d supply, fill #0

## 2024-02-11 MED ORDER — PEN NEEDLES 31G X 5 MM MISC
1.0000 | 0 refills | Status: AC
  Filled 2024-02-11: qty 100, 25d supply, fill #0

## 2024-02-12 LAB — CMP14+EGFR
ALT: 34 [IU]/L (ref 0–44)
AST: 27 [IU]/L (ref 0–40)
Albumin: 4.8 g/dL (ref 3.8–4.9)
Alkaline Phosphatase: 93 [IU]/L (ref 47–123)
BUN/Creatinine Ratio: 13 (ref 9–20)
BUN: 14 mg/dL (ref 6–24)
Bilirubin Total: 0.5 mg/dL (ref 0.0–1.2)
CO2: 22 mmol/L (ref 20–29)
Calcium: 10 mg/dL (ref 8.7–10.2)
Chloride: 104 mmol/L (ref 96–106)
Creatinine, Ser: 1.1 mg/dL (ref 0.76–1.27)
Globulin, Total: 3.2 g/dL (ref 1.5–4.5)
Glucose: 94 mg/dL (ref 70–99)
Potassium: 4.9 mmol/L (ref 3.5–5.2)
Sodium: 141 mmol/L (ref 134–144)
Total Protein: 8 g/dL (ref 6.0–8.5)
eGFR: 78 mL/min/{1.73_m2}

## 2024-02-12 LAB — CBC WITH DIFFERENTIAL/PLATELET
Basophils Absolute: 0 10*3/uL (ref 0.0–0.2)
Basos: 1 %
EOS (ABSOLUTE): 0.1 10*3/uL (ref 0.0–0.4)
Eos: 2 %
Hematocrit: 46.3 % (ref 37.5–51.0)
Hemoglobin: 15.8 g/dL (ref 13.0–17.7)
Immature Grans (Abs): 0 10*3/uL (ref 0.0–0.1)
Immature Granulocytes: 0 %
Lymphocytes Absolute: 2 10*3/uL (ref 0.7–3.1)
Lymphs: 29 %
MCH: 29.9 pg (ref 26.6–33.0)
MCHC: 34.1 g/dL (ref 31.5–35.7)
MCV: 88 fL (ref 79–97)
Monocytes Absolute: 0.5 10*3/uL (ref 0.1–0.9)
Monocytes: 7 %
Neutrophils Absolute: 4.2 10*3/uL (ref 1.4–7.0)
Neutrophils: 61 %
Platelets: 265 10*3/uL (ref 150–450)
RBC: 5.29 x10E6/uL (ref 4.14–5.80)
RDW: 12.3 % (ref 11.6–15.4)
WBC: 6.8 10*3/uL (ref 3.4–10.8)

## 2024-02-13 ENCOUNTER — Ambulatory Visit: Payer: Self-pay | Admitting: Nurse Practitioner

## 2024-03-06 ENCOUNTER — Ambulatory Visit: Payer: Self-pay | Admitting: Pharmacist

## 2024-05-15 ENCOUNTER — Ambulatory Visit: Payer: Self-pay | Admitting: Nurse Practitioner
